# Patient Record
Sex: Female | Born: 1955 | Race: White | Hispanic: No | Smoking: Never smoker
Health system: Southern US, Community
[De-identification: ages and names within clinical notes are randomized; demographics above are authoritative.]

## PROBLEM LIST (undated history)

## (undated) DIAGNOSIS — U071 COVID-19: Secondary | ICD-10-CM

## (undated) DIAGNOSIS — N200 Calculus of kidney: Secondary | ICD-10-CM

## (undated) DIAGNOSIS — C801 Malignant (primary) neoplasm, unspecified: Secondary | ICD-10-CM

## (undated) DIAGNOSIS — G43909 Migraine, unspecified, not intractable, without status migrainosus: Secondary | ICD-10-CM

## (undated) DIAGNOSIS — R011 Cardiac murmur, unspecified: Secondary | ICD-10-CM

## (undated) DIAGNOSIS — E039 Hypothyroidism, unspecified: Secondary | ICD-10-CM

## (undated) DIAGNOSIS — T7840XA Allergy, unspecified, initial encounter: Secondary | ICD-10-CM

## (undated) DIAGNOSIS — B019 Varicella without complication: Secondary | ICD-10-CM

## (undated) HISTORY — DX: Cardiac murmur, unspecified: R01.1

## (undated) HISTORY — DX: Varicella without complication: B01.9

## (undated) HISTORY — DX: Migraine, unspecified, not intractable, without status migrainosus: G43.909

## (undated) HISTORY — DX: COVID-19: U07.1

## (undated) HISTORY — DX: Hypothyroidism, unspecified: E03.9

## (undated) HISTORY — DX: Allergy, unspecified, initial encounter: T78.40XA

## (undated) HISTORY — DX: Calculus of kidney: N20.0

## (undated) HISTORY — DX: Malignant (primary) neoplasm, unspecified: C80.1

---
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
## 1981-02-22 DIAGNOSIS — C801 Malignant (primary) neoplasm, unspecified: Secondary | ICD-10-CM

## 1981-02-22 HISTORY — DX: Malignant (primary) neoplasm, unspecified: C80.1

## 2002-02-22 HISTORY — PX: AUGMENTATION MAMMAPLASTY: SUR837

## 2004-02-23 HISTORY — PX: BREAST ENHANCEMENT SURGERY: SHX7

## 2013-11-25 LAB — HEMOGLOBIN A1C: HEMOGLOBIN A1C: 5.3

## 2014-04-12 LAB — CBC AND DIFFERENTIAL
HEMATOCRIT: 42 % (ref 36–46)
HEMOGLOBIN: 13.4 g/dL (ref 12.0–16.0)
Neutrophils Absolute: 4 /uL
PLATELETS: 253 10*3/uL (ref 150–399)
WBC: 6.2 10*3/mL

## 2014-04-12 LAB — LIPID PANEL
CHOLESTEROL: 221 mg/dL — AB (ref 0–200)
HDL: 64 mg/dL (ref 35–70)
LDL Cholesterol: 139 mg/dL
Triglycerides: 90 mg/dL (ref 40–160)

## 2014-04-12 LAB — BASIC METABOLIC PANEL
BUN: 14 mg/dL (ref 4–21)
Creatinine: 0.9 mg/dL (ref 0.5–1.1)
Glucose: 87 mg/dL
POTASSIUM: 4.1 mmol/L (ref 3.4–5.3)
Sodium: 144 mmol/L (ref 137–147)

## 2014-04-12 LAB — HEPATIC FUNCTION PANEL: ALT: 20 U/L (ref 7–35)

## 2014-04-12 LAB — TSH: TSH: 4.1 u[IU]/mL (ref 0.41–5.90)

## 2015-07-16 ENCOUNTER — Encounter (INDEPENDENT_AMBULATORY_CARE_PROVIDER_SITE_OTHER): Payer: Self-pay

## 2015-07-31 ENCOUNTER — Encounter: Payer: Self-pay | Admitting: Family Medicine

## 2015-07-31 ENCOUNTER — Ambulatory Visit (INDEPENDENT_AMBULATORY_CARE_PROVIDER_SITE_OTHER): Payer: BLUE CROSS/BLUE SHIELD | Admitting: Family Medicine

## 2015-07-31 VITALS — BP 124/70 | HR 75 | Temp 98.0°F | Ht 67.0 in | Wt 143.5 lb

## 2015-07-31 DIAGNOSIS — R6889 Other general symptoms and signs: Secondary | ICD-10-CM

## 2015-07-31 DIAGNOSIS — M79641 Pain in right hand: Secondary | ICD-10-CM | POA: Diagnosis not present

## 2015-07-31 DIAGNOSIS — M79642 Pain in left hand: Secondary | ICD-10-CM

## 2015-07-31 DIAGNOSIS — E890 Postprocedural hypothyroidism: Secondary | ICD-10-CM | POA: Diagnosis not present

## 2015-07-31 DIAGNOSIS — E785 Hyperlipidemia, unspecified: Secondary | ICD-10-CM | POA: Insufficient documentation

## 2015-07-31 DIAGNOSIS — Z130 Encounter for screening for diseases of the blood and blood-forming organs and certain disorders involving the immune mechanism: Secondary | ICD-10-CM | POA: Diagnosis not present

## 2015-07-31 DIAGNOSIS — Z0001 Encounter for general adult medical examination with abnormal findings: Secondary | ICD-10-CM | POA: Insufficient documentation

## 2015-07-31 DIAGNOSIS — Z1239 Encounter for other screening for malignant neoplasm of breast: Secondary | ICD-10-CM

## 2015-07-31 DIAGNOSIS — F419 Anxiety disorder, unspecified: Secondary | ICD-10-CM | POA: Insufficient documentation

## 2015-07-31 LAB — COMPREHENSIVE METABOLIC PANEL
ALBUMIN: 4.4 g/dL (ref 3.5–5.2)
ALK PHOS: 56 U/L (ref 39–117)
ALT: 14 U/L (ref 0–35)
AST: 18 U/L (ref 0–37)
BILIRUBIN TOTAL: 0.5 mg/dL (ref 0.2–1.2)
BUN: 18 mg/dL (ref 6–23)
CALCIUM: 9.9 mg/dL (ref 8.4–10.5)
CO2: 30 mEq/L (ref 19–32)
Chloride: 104 mEq/L (ref 96–112)
Creatinine, Ser: 0.71 mg/dL (ref 0.40–1.20)
GFR: 89.86 mL/min (ref >60.00)
Glucose, Bld: 93 mg/dL (ref 70–99)
Potassium: 4.3 mEq/L (ref 3.5–5.1)
Sodium: 140 mEq/L (ref 135–145)
TOTAL PROTEIN: 7.1 g/dL (ref 6.0–8.3)

## 2015-07-31 LAB — CBC
HCT: 39.3 % (ref 36.0–46.0)
HEMOGLOBIN: 12.9 g/dL (ref 12.0–15.0)
MCHC: 32.9 g/dL (ref 30.0–36.0)
MCV: 91.2 fl (ref 78.0–100.0)
PLATELETS: 253 10*3/uL (ref 150.0–400.0)
RBC: 4.31 Mil/uL (ref 3.87–5.11)
RDW: 13.4 % (ref 11.5–15.5)
WBC: 4.3 10*3/uL (ref 4.0–10.5)

## 2015-07-31 LAB — LIPID PANEL
Cholesterol: 227 mg/dL — ABNORMAL HIGH (ref 0–200)
HDL: 57.8 mg/dL (ref >39.00)
LDL Cholesterol: 154 mg/dL — ABNORMAL HIGH (ref 0–99)
NonHDL: 169.23
TRIGLYCERIDES: 77 mg/dL (ref 0.0–149.0)
Total CHOL/HDL Ratio: 4
VLDL: 15.4 mg/dL (ref 0.0–40.0)

## 2015-07-31 LAB — TSH: TSH: 1.69 u[IU]/mL (ref 0.35–4.50)

## 2015-07-31 MED ORDER — CYCLOBENZAPRINE HCL 5 MG PO TABS: 5.0000 mg | ORAL_TABLET | Freq: Three times a day (TID) | ORAL | Status: DC | PRN

## 2015-07-31 MED ORDER — LORAZEPAM 0.5 MG PO TABS: 0.5000 mg | ORAL_TABLET | ORAL | Status: DC | PRN

## 2015-07-31 NOTE — Patient Instructions (Signed)
Continue your current medications.  Follow up annually.   Take care  Dr. Klayton Monie   Health Maintenance, Female Adopting a healthy lifestyle and getting preventive care can go a long way to promote health and wellness. Talk with your health care provider about what schedule of regular examinations is right for you. This is a good chance for you to check in with your provider about disease prevention and staying healthy. In between checkups, there are plenty of things you can do on your own. Experts have done a lot of research about which lifestyle changes and preventive measures are most likely to keep you healthy. Ask your health care provider for more information. WEIGHT AND DIET  Eat a healthy diet  Be sure to include plenty of vegetables, fruits, low-fat dairy products, and lean protein.  Do not eat a lot of foods high in solid fats, added sugars, or salt.  Get regular exercise. This is one of the most important things you can do for your health.  Most adults should exercise for at least 150 minutes each week. The exercise should increase your heart rate and make you sweat (moderate-intensity exercise).  Most adults should also do strengthening exercises at least twice a week. This is in addition to the moderate-intensity exercise.  Maintain a healthy weight  Body mass index (BMI) is a measurement that can be used to identify possible weight problems. It estimates body fat based on height and weight. Your health care provider can help determine your BMI and help you achieve or maintain a healthy weight.  For females 20 years of age and older:   A BMI below 18.5 is considered underweight.  A BMI of 18.5 to 24.9 is normal.  A BMI of 25 to 29.9 is considered overweight.  A BMI of 30 and above is considered obese.  Watch levels of cholesterol and blood lipids  You should start having your blood tested for lipids and cholesterol at 58 years of age, then have this test every 5  years.  You may need to have your cholesterol levels checked more often if:  Your lipid or cholesterol levels are high.  You are older than 58 years of age.  You are at high risk for heart disease.  CANCER SCREENING   Lung Cancer  Lung cancer screening is recommended for adults 55-80 years old who are at high risk for lung cancer because of a history of smoking.  A yearly low-dose CT scan of the lungs is recommended for people who:  Currently smoke.  Have quit within the past 15 years.  Have at least a 30-pack-year history of smoking. A pack year is smoking an average of one pack of cigarettes a day for 1 year.  Yearly screening should continue until it has been 15 years since you quit.  Yearly screening should stop if you develop a health problem that would prevent you from having lung cancer treatment.  Breast Cancer  Practice breast self-awareness. This means understanding how your breasts normally appear and feel.  It also means doing regular breast self-exams. Let your health care provider know about any changes, no matter how small.  If you are in your 20s or 30s, you should have a clinical breast exam (CBE) by a health care provider every 1-3 years as part of a regular health exam.  If you are 40 or older, have a CBE every year. Also consider having a breast X-ray (mammogram) every year.  If you have a   history of breast cancer, talk to your health care provider about genetic screening.  If you are at high risk for breast cancer, talk to your health care provider about having an MRI and a mammogram every year.  Breast cancer gene (BRCA) assessment is recommended for women who have family members with BRCA-related cancers. BRCA-related cancers include:  Breast.  Ovarian.  Tubal.  Peritoneal cancers.  Results of the assessment will determine the need for genetic counseling and BRCA1 and BRCA2 testing. Cervical Cancer Your health care provider may  recommend that you be screened regularly for cancer of the pelvic organs (ovaries, uterus, and vagina). This screening involves a pelvic examination, including checking for microscopic changes to the surface of your cervix (Pap test). You may be encouraged to have this screening done every 3 years, beginning at age 77.  For women ages 5-65, health care providers may recommend pelvic exams and Pap testing every 3 years, or they may recommend the Pap and pelvic exam, combined with testing for human papilloma virus (HPV), every 5 years. Some types of HPV increase your risk of cervical cancer. Testing for HPV may also be done on women of any age with unclear Pap test results.  Other health care providers may not recommend any screening for nonpregnant women who are considered low risk for pelvic cancer and who do not have symptoms. Ask your health care provider if a screening pelvic exam is right for you.  If you have had past treatment for cervical cancer or a condition that could lead to cancer, you need Pap tests and screening for cancer for at least 20 years after your treatment. If Pap tests have been discontinued, your risk factors (such as having a new sexual partner) need to be reassessed to determine if screening should resume. Some women have medical problems that increase the chance of getting cervical cancer. In these cases, your health care provider may recommend more frequent screening and Pap tests. Colorectal Cancer  This type of cancer can be detected and often prevented.  Routine colorectal cancer screening usually begins at 58 years of age and continues through 58 years of age.  Your health care provider may recommend screening at an earlier age if you have risk factors for colon cancer.  Your health care provider may also recommend using home test kits to check for hidden blood in the stool.  A small camera at the end of a tube can be used to examine your colon directly  (sigmoidoscopy or colonoscopy). This is done to check for the earliest forms of colorectal cancer.  Routine screening usually begins at age 34.  Direct examination of the colon should be repeated every 5-10 years through 58 years of age. However, you may need to be screened more often if early forms of precancerous polyps or small growths are found. Skin Cancer  Check your skin from head to toe regularly.  Tell your health care provider about any new moles or changes in moles, especially if there is a change in a mole's shape or color.  Also tell your health care provider if you have a mole that is larger than the size of a pencil eraser.  Always use sunscreen. Apply sunscreen liberally and repeatedly throughout the day.  Protect yourself by wearing long sleeves, pants, a wide-brimmed hat, and sunglasses whenever you are outside. HEART DISEASE, DIABETES, AND HIGH BLOOD PRESSURE   High blood pressure causes heart disease and increases the risk of stroke. High blood pressure  is more likely to develop in:  People who have blood pressure in the high end of the normal range (130-139/85-89 mm Hg).  People who are overweight or obese.  People who are African American.  If you are 20-25 years of age, have your blood pressure checked every 3-5 years. If you are 71 years of age or older, have your blood pressure checked every year. You should have your blood pressure measured twice--once when you are at a hospital or clinic, and once when you are not at a hospital or clinic. Record the average of the two measurements. To check your blood pressure when you are not at a hospital or clinic, you can use:  An automated blood pressure machine at a pharmacy.  A home blood pressure monitor.  If you are between 41 years and 9 years old, ask your health care provider if you should take aspirin to prevent strokes.  Have regular diabetes screenings. This involves taking a blood sample to check your  fasting blood sugar level.  If you are at a normal weight and have a low risk for diabetes, have this test once every three years after 58 years of age.  If you are overweight and have a high risk for diabetes, consider being tested at a younger age or more often. PREVENTING INFECTION  Hepatitis B  If you have a higher risk for hepatitis B, you should be screened for this virus. You are considered at high risk for hepatitis B if:  You were born in a country where hepatitis B is common. Ask your health care provider which countries are considered high risk.  Your parents were born in a high-risk country, and you have not been immunized against hepatitis B (hepatitis B vaccine).  You have HIV or AIDS.  You use needles to inject street drugs.  You live with someone who has hepatitis B.  You have had sex with someone who has hepatitis B.  You get hemodialysis treatment.  You take certain medicines for conditions, including cancer, organ transplantation, and autoimmune conditions. Hepatitis C  Blood testing is recommended for:  Everyone born from [birth year] through 1963.  Anyone with known risk factors for hepatitis C. Sexually transmitted infections (STIs)  You should be screened for sexually transmitted infections (STIs) including gonorrhea and chlamydia if:  You are sexually active and are younger than 58 years of age.  You are older than 58 years of age and your health care provider tells you that you are at risk for this type of infection.  Your sexual activity has changed since you were last screened and you are at an increased risk for chlamydia or gonorrhea. Ask your health care provider if you are at risk.  If you do not have HIV, but are at risk, it may be recommended that you take a prescription medicine daily to prevent HIV infection. This is called pre-exposure prophylaxis (PrEP). You are considered at risk if:  You are sexually active and do not regularly use condoms or  know the HIV status of your partner(s).  You take drugs by injection.  You are sexually active with a partner who has HIV. Talk with your health care provider about whether you are at high risk of being infected with HIV. If you choose to begin PrEP, you should first be tested for HIV. You should then be tested every 3 months for as long as you are taking PrEP.  PREGNANCY   If you are premenopausal and  you may become pregnant, ask your health care provider about preconception counseling.  If you may become pregnant, take 400 to 800 micrograms (mcg) of folic acid every day.  If you want to prevent pregnancy, talk to your health care provider about birth control (contraception). OSTEOPOROSIS AND MENOPAUSE   Osteoporosis is a disease in which the bones lose minerals and strength with aging. This can result in serious bone fractures. Your risk for osteoporosis can be identified using a bone density scan.  If you are 71 years of age or older, or if you are at risk for osteoporosis and fractures, ask your health care provider if you should be screened.  Ask your health care provider whether you should take a calcium or vitamin D supplement to lower your risk for osteoporosis.  Menopause may have certain physical symptoms and risks.  Hormone replacement therapy may reduce some of these symptoms and risks. Talk to your health care provider about whether hormone replacement therapy is right for you.  HOME CARE INSTRUCTIONS   Schedule regular health, dental, and eye exams.  Stay current with your immunizations.   Do not use any tobacco products including cigarettes, chewing tobacco, or electronic cigarettes.  If you are pregnant, do not drink alcohol.  If you are breastfeeding, limit how much and how often you drink alcohol.  Limit alcohol intake to no more than 1 drink per day for nonpregnant women. One drink equals 12 ounces of beer, 5 ounces of wine, or 1 ounces of hard liquor.  Do  not use street drugs.  Do not share needles.  Ask your health care provider for help if you need support or information about quitting drugs.  Tell your health care provider if you often feel depressed.  Tell your health care provider if you have ever been abused or do not feel safe at home.   This information is not intended to replace advice given to you by your health care provider. Make sure you discuss any questions you have with your health care provider.   Document Released: 08/24/2010 Document Revised: 03/01/2014 Document Reviewed: 01/10/2013 Elsevier Interactive Patient Education [employer].

## 2015-07-31 NOTE — Progress Notes (Signed)
Pre visit review using our clinic review tool, if applicable. No additional management support is needed unless otherwise documented below in the visit note. 

## 2015-07-31 NOTE — Assessment & Plan Note (Signed)
In need of mammogram. Will order. Pap smear up-to-date. Colonoscopy up-to-date. Immunizations up-to-date. Screening labs today.

## 2015-07-31 NOTE — Assessment & Plan Note (Signed)
New problem. Suspect underlying OA. Discuss work up and patient declined at this time. Advised PRN Tylenol/motrin.

## 2015-07-31 NOTE — Progress Notes (Signed)
Subjective:  Patient ID: Jenna Davidson, female    DOB: [birth year]  Age: 61 y.o. MRN: [id number]  CC: Establish care  HPI Jenna Davidson is a 60 y.o. female presents to the clinic today to establish care. Additional concern as below.  Preventative Healthcare  Pap smear: 2015. Up to date.   Mammogram: In Need of mammogram. Was last done in 2015.  Colonoscopy: Up-to-date. Was done in 2011.  Immunizations  Tetanus - up-to-date.  Pneumococcal - Not indicated at this time.  Hepatitis C screening - Candidate for.  Labs: Screening labs today.  Exercise: Exercises regularly.  Alcohol use: See below.  Smoking/tobacco use: Nonsmoker.  STD/HIV testing: Low risk.  Regular dental exams: Yes.   Wears seat belt: Yes.   Hand pain  Patient reports that she has been experiencing bilateral hand pain particularly in the morning.  Pain is diffuse and nonfocal.  She states that this has been present for the past several months.  She states that it improves later in the morning after movement/activity.  She reports that she often has swelling in the morning as well.  No known exacerbating factors.  No reported history of RA.  PMH, Surgical Hx, Family Hx, Social History reviewed and updated as below.  Past Medical History  Diagnosis Date  . Chicken pox   . Migraines   . Kidney stones   . Allergy   . Heart murmur   . Hypothyroidism   . Cancer ([place]) 6606    follicular papillary carcinoma; s/p thyroidectomy   Past Surgical History  Procedure Laterality Date  . Breast enhancement surgery  2006    breast augmentation   . Tonsillectomy and adenoidectomy  1967   Family History  Problem Relation Age of Onset  . Alcohol abuse Father   . Lung cancer Father   . Heart disease Father   . Diabetes Maternal Grandmother   . Diabetes Maternal Grandfather   . Uterine cancer Paternal Grandmother   . Heart disease Paternal Grandmother   . Heart disease Paternal Grandfather   .  Stroke Paternal Grandfather    Social History  Substance Use Topics  . Smoking status: Never Smoker   . Smokeless tobacco: Never Used  . Alcohol Use: 0.0 - 0.6 oz/week    0-1 Standard drinks or equivalent per week   Review of Systems  Musculoskeletal:       Hand pain, knee pain, foot pain.   Psychiatric/Behavioral: Positive for sleep disturbance. The patient is nervous/anxious.   All other systems reviewed and are negative.  Objective:   Today's Vitals: BP 124/70 mmHg  Pulse 75  Temp(Src) 98 F (36.7 C) (Oral)  Ht _0  (1.702 m)  Wt 143 lb 8 oz (65.091 kg)  BMI 22.47 kg/m2  SpO2 98%  Physical Exam  Constitutional: She is oriented to person, place, and time. She appears well-developed. No distress.  HENT:  Head: Normocephalic and atraumatic.  Normal TMs bilaterally.  Eyes: Conjunctivae are normal. No scleral icterus.  Neck: Neck supple.  Midline scar noted from prior thyroidectomy.  Cardiovascular: Normal rate and regular rhythm.   2/6 systolic murmur, Right second intercostal space.   Pulmonary/Chest: Effort normal and breath sounds normal. She has no wheezes. She has no rales.  Abdominal: Soft. She exhibits no distension. There is no tenderness.  Musculoskeletal:  Hands - no swelling, warmth, erythema noted. Good range of motion of digits  Lymphadenopathy:    She has no cervical adenopathy.  Neurological: She is alert  and oriented to person, place, and time.  Skin: Skin is warm and dry. No rash noted.  Psychiatric: She has a normal mood and affect.  Vitals reviewed.  Assessment & Plan:   Problem List Items Addressed This Visit    Bilateral hand pain    New problem. Suspect underlying OA. Discuss work up and patient declined at this time. Advised PRN Tylenol/motrin.      Encounter for preventative adult health care exam with abnormal findings - Primary    In need of mammogram. Will order. Pap smear up-to-date. Colonoscopy up-to-date. Immunizations  up-to-date. Screening labs today.      Hyperlipidemia   Relevant Orders   Lipid Profile   Postoperative hypothyroidism   Relevant Medications   SYNTHROID 112 MCG tablet   Other Relevant Orders   Comp Met (CMET)   TSH    Other Visit Diagnoses    Screening for deficiency anemia        Relevant Orders    CBC    Screening for breast cancer        Relevant Orders    MM DIGITAL SCREENING BILATERAL       Outpatient Encounter Prescriptions as of 07/31/2015  Medication Sig  . Acetaminophen (TYLENOL PO) Take by mouth as needed.  . chlorpheniramine (CHLOR-TRIMETON) 4 MG tablet Take 4 mg by mouth as needed for allergies.  . Cholecalciferol (D3 ADULT PO) Take 5,000 Units by mouth.  . Cyanocobalamin 5000 MCG CAPS Take by mouth.  . cyclobenzaprine (FLEXERIL) 5 MG tablet Take 1 tablet (5 mg total) by mouth 3 (three) times daily as needed for muscle spasms.  . Glucosamine 500 MG CAPS Take by mouth.  . Ibuprofen-Diphenhydramine Cit (IBUPROFEN PM PO) Take by mouth as needed.  Marland Kitchen LORazepam (ATIVAN) 0.5 MG tablet Take 1 tablet (0.5 mg total) by mouth as needed for anxiety.  . Magnesium 500 MG CAPS Take by mouth.  . Naproxen Sodium (ALEVE PO) Take by mouth.  . Omega-3 Fatty Acids (OMEGA 3 PO) Take 1,000 mg by mouth.  . promethazine (PHENERGAN) 25 MG tablet as needed.  . rizatriptan (MAXALT) 5 MG tablet as needed.  Marland Kitchen SYNTHROID 112 MCG tablet daily.  . Zinc 50 MG TABS Take by mouth.  . [DISCONTINUED] cyclobenzaprine (FLEXERIL) 5 MG tablet Take 5 mg by mouth as needed for muscle spasms.  . [DISCONTINUED] LORazepam (ATIVAN) 0.5 MG tablet Take 0.5 mg by mouth as needed for anxiety.   No facility-administered encounter medications on file as of 07/31/2015.   Follow-up: Annual  Thersa Salt DO [facility]

## 2015-09-08 ENCOUNTER — Ambulatory Visit: Payer: BLUE CROSS/BLUE SHIELD | Attending: Family Medicine

## 2015-10-12 DIAGNOSIS — R51 Headache: Secondary | ICD-10-CM | POA: Diagnosis not present

## 2015-10-12 DIAGNOSIS — R0981 Nasal congestion: Secondary | ICD-10-CM | POA: Diagnosis not present

## 2015-11-05 ENCOUNTER — Encounter: Payer: Self-pay | Admitting: Family Medicine

## 2015-11-05 ENCOUNTER — Ambulatory Visit (INDEPENDENT_AMBULATORY_CARE_PROVIDER_SITE_OTHER): Payer: BLUE CROSS/BLUE SHIELD | Admitting: Family Medicine

## 2015-11-05 DIAGNOSIS — R0982 Postnasal drip: Secondary | ICD-10-CM | POA: Insufficient documentation

## 2015-11-05 DIAGNOSIS — E890 Postprocedural hypothyroidism: Secondary | ICD-10-CM | POA: Diagnosis not present

## 2015-11-05 DIAGNOSIS — Z23 Encounter for immunization: Secondary | ICD-10-CM

## 2015-11-05 DIAGNOSIS — E785 Hyperlipidemia, unspecified: Secondary | ICD-10-CM | POA: Diagnosis not present

## 2015-11-05 DIAGNOSIS — F419 Anxiety disorder, unspecified: Secondary | ICD-10-CM | POA: Diagnosis not present

## 2015-11-05 NOTE — Progress Notes (Signed)
Subjective:  Patient ID: Jenna Davidson, female    DOB: 19-Dec-1955  Age: 60 y.o. MRN: [id number]  CC: Follow up  HPI:  60 year old female with hypothyroidism, hyperlipidemia, anxiety presents for follow-up. Patient has a complaint of drainage/post nasal drip.  Anxiety  Stable on PRN Ativan.  Doing well.  HLD  Stable.  Not currently being treated. Will discuss today.  Hypothyroidism  Stable on Synthroid 112.  No weight gain, increasing fatigue, constipation or other symptoms.  Drainage/post nasal drip  Begin initially with a likely respiratory infection.  Patient has been experiencing postnasal drip/drainage for the past month.  Patient initially took some Sudafed without improvement.  She reports associated intermittent sore throat.  Exacerbated by allergies and potentially by "scents".  No recent fever, chills. No other symptoms.  Social Hx   Social History   Social History  . Marital status: Widowed    Spouse name: N/A  . Number of children: N/A  . Years of education: N/A   Social History Main Topics  . Smoking status: Never Smoker  . Smokeless tobacco: Never Used  . Alcohol use 0.0 - 0.6 oz/week  . Drug use: No  . Sexual activity: Not Asked   Other Topics Concern  . None   Social History Narrative  . None    Review of Systems  Constitutional: Negative.   HENT: Positive for postnasal drip and sore throat.    Objective:  BP 123/85 (BP Location: Right Arm, Patient Position: Sitting, Cuff Size: Normal)   Pulse 77   Temp 98.2 F (36.8 C) (Oral)   Wt 142 lb 6 oz (64.6 kg)   SpO2 100%   BMI 22.30 kg/m   BP/Weight [id number]  Systolic BP [id number]  Diastolic BP 85 70  Wt. (Lbs) 142.38 143.5  BMI 22.3 22.47   Physical Exam  Constitutional: She is oriented to person, place, and time. She appears well-developed. No distress.  HENT:  Head: Normocephalic and atraumatic.  Mouth/Throat: Oropharynx is clear and moist.  Normal TM's  bilaterally.  Cardiovascular: Normal rate and regular rhythm.   Pulmonary/Chest: Effort normal. She has no wheezes. She has no rales.  Neurological: She is alert and oriented to person, place, and time.  Psychiatric: She has a normal mood and affect.  Vitals reviewed.   Lab Results  Component Value Date   WBC 4.3 07/31/2015   HGB 12.9 07/31/2015   HCT 39.3 07/31/2015   PLT 253.0 07/31/2015   GLUCOSE 93 07/31/2015   CHOL 227 (H) 07/31/2015   TRIG 77.0 07/31/2015   HDL 57.80 07/31/2015   LDLCALC 154 (H) 07/31/2015   ALT 14 07/31/2015   AST 18 07/31/2015   NA 140 07/31/2015   K 4.3 07/31/2015   CL 104 07/31/2015   CREATININE 0.71 07/31/2015   BUN 18 07/31/2015   CO2 30 07/31/2015   TSH 1.69 07/31/2015   HGBA1C 5.3 11/25/2013    Assessment & Plan:   Problem List Items Addressed This Visit    Postoperative hypothyroidism    Stable. Continue current dose of Synthroid.      Hyperlipidemia    Stable. ASCVD risk score 2.7%. Will continue to monitor closely. No statin at this time.      Anxiety    Stable. Continue Ativan.      Post-nasal drip    New problem. No evidence of bacterial infection. Advised use of Flonase and over-the-counter antihistamine.       Other Visit Diagnoses   None.  Meds ordered this encounter  Medications  . TURMERIC PO    Sig: Take by mouth.    Follow-up: Annually  [facility]

## 2015-11-05 NOTE — Assessment & Plan Note (Deleted)
Stable. Continue current dose of Synthroid. 

## 2015-11-05 NOTE — Assessment & Plan Note (Signed)
Stable. Continue Ativan. 

## 2015-11-05 NOTE — Patient Instructions (Signed)
Flonase and the antihistamine for your post nasal drip.  Continue your medications.  Follow up annually  Take care  Dr. Lacinda Axon

## 2015-11-05 NOTE — Progress Notes (Signed)
Pre visit review using our clinic review tool, if applicable. No additional management support is needed unless otherwise documented below in the visit note. 

## 2015-11-05 NOTE — Assessment & Plan Note (Signed)
Stable. Continue current dose of Synthroid. 

## 2015-11-05 NOTE — Assessment & Plan Note (Signed)
Stable. ASCVD risk score 2.7%. Will continue to monitor closely. No statin at this time.

## 2015-11-05 NOTE — Assessment & Plan Note (Signed)
New problem. No evidence of bacterial infection. Advised use of Flonase and over-the-counter antihistamine.

## 2015-11-16 ENCOUNTER — Encounter: Payer: Self-pay | Admitting: Family Medicine

## 2015-11-17 ENCOUNTER — Encounter: Payer: Self-pay | Admitting: Family

## 2015-11-17 ENCOUNTER — Ambulatory Visit (INDEPENDENT_AMBULATORY_CARE_PROVIDER_SITE_OTHER): Payer: BLUE CROSS/BLUE SHIELD | Admitting: Family

## 2015-11-17 ENCOUNTER — Telehealth: Payer: Self-pay | Admitting: *Deleted

## 2015-11-17 VITALS — BP 128/78 | HR 67 | Temp 98.7°F | Ht 67.0 in | Wt 145.5 lb

## 2015-11-17 DIAGNOSIS — H1089 Other conjunctivitis: Secondary | ICD-10-CM | POA: Diagnosis not present

## 2015-11-17 DIAGNOSIS — A499 Bacterial infection, unspecified: Secondary | ICD-10-CM | POA: Diagnosis not present

## 2015-11-17 DIAGNOSIS — H109 Unspecified conjunctivitis: Secondary | ICD-10-CM

## 2015-11-17 MED ORDER — CIPROFLOXACIN HCL 0.3 % OP SOLN: OPHTHALMIC | 0 refills | Status: DC

## 2015-11-17 NOTE — Progress Notes (Signed)
Pre visit review using our clinic review tool, if applicable. No additional management support is needed unless otherwise documented below in the visit note. 

## 2015-11-17 NOTE — Progress Notes (Addendum)
Subjective:    Patient ID: Jenna Davidson, female    DOB: 04/19/1955, 60 y.o.   MRN: [id number]  CC: Jenna Davidson is a 60 y.o. female who presents today for an acute visit.    HPI: Patient here for evaluation of bilateral pinkeye which started over the weekend 2 days ago. Started in right eye and started in left eye. Right eyelid has been swollen, resolved. Had been with grandson that day. Stuck shut. No fever, vision changes, eye pain, photophobia, foreign body feeling in eye, gritty sensation. Endorses sinus congestion which started yesteday. Has had post nasal drip and started on Flonase. Has been using OTC eye drops and warm compresses with some improvement. Personally reviewed images from the patient sent via Mychart yesterday morning.  wear contact lenses with good hygiene.      HISTORY:  Past Medical History:  Diagnosis Date  . Allergy   . Cancer ([place]) [id number]   follicular papillary carcinoma; s/p thyroidectomy  . Chicken pox   . Heart murmur   . Hypothyroidism   . Kidney stones   . Migraines    Past Surgical History:  Procedure Laterality Date  . BREAST ENHANCEMENT SURGERY  2006   breast augmentation   . TONSILLECTOMY AND ADENOIDECTOMY  1967   Family History  Problem Relation Age of Onset  . Alcohol abuse Father   . Lung cancer Father   . Heart disease Father   . Diabetes Maternal Grandmother   . Diabetes Maternal Grandfather   . Uterine cancer Paternal Grandmother   . Heart disease Paternal Grandmother   . Heart disease Paternal Grandfather   . Stroke Paternal Grandfather     Allergies: Review of patient's allergies indicates no known allergies. Current Outpatient Prescriptions on File Prior to Visit  Medication Sig Dispense Refill  . Acetaminophen (TYLENOL PO) Take by mouth as needed.    . chlorpheniramine (CHLOR-TRIMETON) 4 MG tablet Take 4 mg by mouth as needed for allergies.    . Cholecalciferol (D3 ADULT PO) Take 5,000 Units by mouth.    . Cyanocobalamin  5000 MCG CAPS Take by mouth.    . cyclobenzaprine (FLEXERIL) 5 MG tablet Take 1 tablet (5 mg total) by mouth 3 (three) times daily as needed for muscle spasms. 30 tablet 3  . Glucosamine 500 MG CAPS Take by mouth.    . Ibuprofen-Diphenhydramine Cit (IBUPROFEN PM PO) Take by mouth as needed.    Marland Kitchen LORazepam (ATIVAN) 0.5 MG tablet Take 1 tablet (0.5 mg total) by mouth as needed for anxiety. 30 tablet 0  . Magnesium 500 MG CAPS Take by mouth.    . Naproxen Sodium (ALEVE PO) Take by mouth.    . Omega-3 Fatty Acids (OMEGA 3 PO) Take 1,000 mg by mouth.    . promethazine (PHENERGAN) 25 MG tablet as needed.    . rizatriptan (MAXALT) 5 MG tablet as needed.    Marland Kitchen SYNTHROID 112 MCG tablet daily.    . TURMERIC PO Take by mouth.    . Zinc 50 MG TABS Take by mouth.     No current facility-administered medications on file prior to visit.     Social History  Substance Use Topics  . Smoking status: Never Smoker  . Smokeless tobacco: Never Used  . Alcohol use 0.0 - 0.6 oz/week    Review of Systems  Constitutional: Negative for chills and fever.  HENT: Negative for congestion, sinus pressure and sore throat.   Eyes: Positive for discharge (clear), redness and  itching. Negative for photophobia, pain and visual disturbance.  Respiratory: Negative for cough.   Cardiovascular: Negative for chest pain and palpitations.  Gastrointestinal: Negative for nausea and vomiting.      Objective:    BP 128/78   Pulse 67   Temp 98.7 F (37.1 C) (Oral)   Ht 5\' 7"  (1.702 m)   Wt 145 lb 8 oz (66 kg)   BMI 22.79 kg/m    Physical Exam  Constitutional: She appears well-developed and well-nourished.  HENT:  Head: Normocephalic and atraumatic.  Right Ear: Hearing, tympanic membrane, external ear and ear canal normal. No drainage, swelling or tenderness. No foreign bodies. Tympanic membrane is not erythematous and not bulging. No middle ear effusion. No decreased hearing is noted.  Left Ear: Hearing, tympanic  membrane, external ear and ear canal normal. No drainage, swelling or tenderness. No foreign bodies. Tympanic membrane is not erythematous and not bulging.  No middle ear effusion. No decreased hearing is noted.  Nose: No rhinorrhea. Right sinus exhibits no maxillary sinus tenderness and no frontal sinus tenderness. Left sinus exhibits no maxillary sinus tenderness and no frontal sinus tenderness.  Mouth/Throat: Uvula is midline and mucous membranes are normal. Posterior oropharyngeal edema present. No oropharyngeal exudate, posterior oropharyngeal erythema or tonsillar abscesses.  Eyes: EOM are normal. Pupils are equal, round, and reactive to light. Lids are everted and swept, no foreign bodies found. Right eye exhibits no discharge and no hordeolum. No foreign body present in the right eye. Left eye exhibits no discharge and no hordeolum. No foreign body present in the left eye. Right conjunctiva is injected. Right conjunctiva has no hemorrhage. Left conjunctiva is injected. Left conjunctiva has no hemorrhage. No scleral icterus.  No external eye lesions. No eyelid swelling. Surrounding skin intact.   Right eye:   Diffuse injection of the conjunctiva. No white spots, opacity, or foreign body appreciated. No collection of blood or pus in the anterior chamber. No ciliary flush surrounding iris.  No photophobia or eye pain appreciated during exam.    Left eye:   Minimal injection of the conjunctiva. No white spots, opacity, or foreign body appreciated. No collection of blood or pus in the anterior chamber. No ciliary flush surrounding iris.  No photophobia or eye pain appreciated during exam.   Cardiovascular: Regular rhythm, normal heart sounds and normal pulses.   Pulmonary/Chest: Effort normal and breath sounds normal. She has no wheezes. She has no rhonchi. She has no rales.  Lymphadenopathy:       Head (right side): No submental, no submandibular, no tonsillar, no preauricular, no posterior  auricular and no occipital adenopathy present.       Head (left side): No submental, no submandibular, no tonsillar, no preauricular, no posterior auricular and no occipital adenopathy present.    She has no cervical adenopathy.  Neurological: She is alert.  Skin: Skin is warm and dry.  Psychiatric: She has a normal mood and affect. Her speech is normal and behavior is normal. Thought content normal.  Vitals reviewed.      Assessment & Plan:  1. Bacterial conjunctivitis No vision changes, floaters, reduced acuity, photophobia, foreign body sensation to suggest keratitis or iritis. The onset has been longer than 24 hours, which has improved making keratitis less likely. Patient is a contact lens wearer however she reports good hygiene with contact lenses. Flouroquinolone OP appropriate to cover for pseudomonas. Return precautions given.   - ciprofloxacin (CILOXAN) 0.3 % ophthalmic solution; 1-2 gtt in eye(s) q2h  while awake x 2 days, then q4h x 5 days.  Dispense: 5 mL; Refill: 0     I am having Ms. Andre start on ciprofloxacin. I am also having her maintain her SYNTHROID, rizatriptan, promethazine, chlorpheniramine, Ibuprofen-Diphenhydramine Cit (IBUPROFEN PM PO), Acetaminophen (TYLENOL PO), Naproxen Sodium (ALEVE PO), Omega-3 Fatty Acids (OMEGA 3 PO), Cholecalciferol (D3 ADULT PO), Glucosamine, Cyanocobalamin, Zinc, Magnesium, cyclobenzaprine, LORazepam, and TURMERIC PO.   Meds ordered this encounter  Medications  . ciprofloxacin (CILOXAN) 0.3 % ophthalmic solution    Sig: 1-2 gtt in eye(s) q2h while awake x 2 days, then q4h x 5 days.    Dispense:  5 mL    Refill:  0    Order Specific Question:   Supervising Provider    Answer:   Crecencio Mc [[id number]]    Return precautions given.   Risks, benefits, and alternatives of the medications and treatment plan prescribed today were discussed, and patient expressed understanding.   Education regarding symptom management and diagnosis  given to patient on AVS.  Continue to follow with Coral Spikes, DO for routine health maintenance.   Jenna Davidson and I agreed with plan.   Mable Paris, FNP

## 2015-11-17 NOTE — Telephone Encounter (Signed)
Scheduled pt for possible pink eye

## 2015-11-17 NOTE — Patient Instructions (Signed)
Please let me know urgently if you have eye pain, vision changes, or sensitivity to light.  You should start to see improvement with 12-24 hours.   If there is no improvement in your symptoms, or if there is any worsening of symptoms, or if you have any additional concerns, please return for re-evaluation; or, if we are closed, consider going to the Emergency Room for evaluation if symptoms urgent.   Bacterial Conjunctivitis Bacterial conjunctivitis, commonly called pink eye, is an inflammation of the clear membrane that covers the white part of the eye (conjunctiva). The inflammation can also happen on the underside of the eyelids. The blood vessels in the conjunctiva become inflamed, causing the eye to become red or pink. Bacterial conjunctivitis may spread easily from one eye to another and from person to person (contagious).  CAUSES  Bacterial conjunctivitis is caused by bacteria. The bacteria may come from your own skin, your upper respiratory tract, or from someone else with bacterial conjunctivitis. SYMPTOMS  The normally white color of the eye or the underside of the eyelid is usually pink or red. The pink eye is usually associated with irritation, tearing, and some sensitivity to light. Bacterial conjunctivitis is often associated with a thick, yellowish discharge from the eye. The discharge may turn into a crust on the eyelids overnight, which causes your eyelids to stick together. If a discharge is present, there may also be some blurred vision in the affected eye. DIAGNOSIS  Bacterial conjunctivitis is diagnosed by your caregiver through an eye exam and the symptoms that you report. Your caregiver looks for changes in the surface tissues of your eyes, which may point to the specific type of conjunctivitis. A sample of any discharge may be collected on a cotton-tip swab if you have a severe case of conjunctivitis, if your cornea is affected, or if you keep getting repeat infections that do  not respond to treatment. The sample will be sent to a lab to see if the inflammation is caused by a bacterial infection and to see if the infection will respond to antibiotic medicines. TREATMENT   Bacterial conjunctivitis is treated with antibiotics. Antibiotic eyedrops are most often used. However, antibiotic ointments are also available. Antibiotics pills are sometimes used. Artificial tears or eye washes may ease discomfort. HOME CARE INSTRUCTIONS   To ease discomfort, apply a cool, clean washcloth to your eye for 10-20 minutes, 3-4 times a day.  Gently wipe away any drainage from your eye with a warm, wet washcloth or a cotton ball.  Wash your hands often with soap and water. Use paper towels to dry your hands.  Do not share towels or washcloths. This may spread the infection.  Change or wash your pillowcase every day.  You should not use eye makeup until the infection is gone.  Do not operate machinery or drive if your vision is blurred.  Stop using contact lenses. Ask your caregiver how to sterilize or replace your contacts before using them again. This depends on the type of contact lenses that you use.  When applying medicine to the infected eye, do not touch the edge of your eyelid with the eyedrop bottle or ointment tube. SEEK IMMEDIATE MEDICAL CARE IF:   Your infection has not improved within 3 days after beginning treatment.  You had yellow discharge from your eye and it returns.  You have increased eye pain.  Your eye redness is spreading.  Your vision becomes blurred.  You have a fever or persistent symptoms  for more than 2-3 days.  You have a fever and your symptoms suddenly get worse.  You have facial pain, redness, or swelling. MAKE SURE YOU:   Understand these instructions.  Will watch your condition.  Will get help right away if you are not doing well or get worse.   This information is not intended to replace advice given to you by your health  care provider. Make sure you discuss any questions you have with your health care provider.   Document Released: 02/08/2005 Document Revised: 03/01/2014 Document Reviewed: 07/12/2011 Elsevier Interactive Patient Education [employer].

## 2016-01-08 ENCOUNTER — Encounter: Payer: Self-pay | Admitting: Family Medicine

## 2016-01-09 ENCOUNTER — Other Ambulatory Visit: Payer: Self-pay | Admitting: Family Medicine

## 2016-01-09 MED ORDER — SYNTHROID 112 MCG PO TABS: 112.0000 ug | ORAL_TABLET | Freq: Every day | ORAL | 0 refills | Status: DC

## 2016-01-09 MED ORDER — SYNTHROID 112 MCG PO TABS: 112.0000 ug | ORAL_TABLET | Freq: Every day | ORAL | 2 refills | Status: DC

## 2016-01-09 MED ORDER — RIZATRIPTAN BENZOATE 5 MG PO TABS: ORAL_TABLET | ORAL | 0 refills | Status: DC

## 2016-01-09 NOTE — Telephone Encounter (Signed)
Historical medication. Pt last seen 11/17/15. Please advise?

## 2016-01-12 ENCOUNTER — Other Ambulatory Visit: Payer: Self-pay

## 2016-02-05 ENCOUNTER — Other Ambulatory Visit: Payer: Self-pay | Admitting: Family Medicine

## 2016-05-18 DIAGNOSIS — N3001 Acute cystitis with hematuria: Secondary | ICD-10-CM | POA: Diagnosis not present

## 2016-05-18 DIAGNOSIS — R300 Dysuria: Secondary | ICD-10-CM | POA: Diagnosis not present

## 2016-07-29 ENCOUNTER — Other Ambulatory Visit (HOSPITAL_COMMUNITY)
Admission: RE | Admit: 2016-07-29 | Discharge: 2016-07-29 | Disposition: A | Discharge status: Home / Self Care | Payer: BLUE CROSS/BLUE SHIELD | Source: Ambulatory Visit | Attending: Family Medicine | Admitting: Family Medicine

## 2016-07-29 ENCOUNTER — Encounter: Payer: Self-pay | Admitting: Family Medicine

## 2016-07-29 ENCOUNTER — Ambulatory Visit (INDEPENDENT_AMBULATORY_CARE_PROVIDER_SITE_OTHER): Payer: BLUE CROSS/BLUE SHIELD | Admitting: Family Medicine

## 2016-07-29 VITALS — BP 122/74 | HR 81 | Temp 98.0°F | Resp 16 | Wt 138.0 lb

## 2016-07-29 DIAGNOSIS — Z124 Encounter for screening for malignant neoplasm of cervix: Secondary | ICD-10-CM

## 2016-07-29 DIAGNOSIS — N898 Other specified noninflammatory disorders of vagina: Secondary | ICD-10-CM | POA: Diagnosis not present

## 2016-07-29 DIAGNOSIS — R87810 Cervical high risk human papillomavirus (HPV) DNA test positive: Secondary | ICD-10-CM | POA: Insufficient documentation

## 2016-07-29 DIAGNOSIS — N870 Mild cervical dysplasia: Secondary | ICD-10-CM | POA: Insufficient documentation

## 2016-07-29 MED ORDER — LORAZEPAM 0.5 MG PO TABS: 0.5000 mg | ORAL_TABLET | ORAL | 0 refills | Status: DC | PRN

## 2016-07-29 MED ORDER — RIZATRIPTAN BENZOATE 5 MG PO TABS: ORAL_TABLET | ORAL | 0 refills | Status: DC

## 2016-07-29 NOTE — Telephone Encounter (Signed)
Do you want to refer to GYN or see her?

## 2016-07-29 NOTE — Telephone Encounter (Signed)
Needs to see me

## 2016-07-29 NOTE — Progress Notes (Signed)
   Subjective:  Patient ID: Jenna Davidson, female    DOB: [birth year]  Age: 61 y.o. MRN: [id number]  CC: Vaginal discharge  HPI:  61 year old female presents with vaginal discharge.  Patient reports a one-month history of yellow vaginal discharge. No associated itching or odor. No dysuria. She's had the same sexual partner for the past 3 years. She endorses monogamy. No pelvic pain. She has had some recent dyspareunia but this has since resolved. Last time she had intercourse was approximately 2 weeks ago. She is in need of Pap smear. No other complaints or concerns this time.  Social Hx   Social History   Social History  . Marital status: Widowed    Spouse name: N/A  . Number of children: N/A  . Years of education: N/A   Social History Main Topics  . Smoking status: Never Smoker  . Smokeless tobacco: Never Used  . Alcohol use 0.0 - 0.6 oz/week  . Drug use: No  . Sexual activity: Not Asked   Other Topics Concern  . None   Social History Narrative  . None    Review of Systems  Constitutional: Negative.   Genitourinary: Positive for vaginal discharge. Negative for dysuria, pelvic pain and vaginal bleeding.   Objective:  BP 122/74 (BP Location: Left Arm, Patient Position: Sitting, Cuff Size: Normal)   Pulse 81   Temp 98 F (36.7 C) (Oral)   Resp 16   Wt 138 lb (62.6 kg)   SpO2 98%   BMI 21.61 kg/m   BP/Weight 07/29/2016 11/17/2015 09/10/9468  Systolic BP 962 836 629  Diastolic BP 74 78 85  Wt. (Lbs) 138 145.5 142.38  BMI 21.61 22.79 22.3   Physical Exam  Constitutional: She is oriented to person, place, and time. She appears well-developed. No distress.  Cardiovascular: Normal rate and regular rhythm.   Pulmonary/Chest: Effort normal and breath sounds normal.  Genitourinary:  Genitourinary Comments: Pelvic Exam: External: normal female genitalia without lesions or masses Vagina: Yellow vaginal discharge noted. Cervix: Numerous patches of erythema, discharge noted  from the os. Pap smear: performed.   Neurological: She is alert and oriented to person, place, and time.  Psychiatric: She has a normal mood and affect.  Vitals reviewed.   Lab Results  Component Value Date   WBC 4.3 07/31/2015   HGB 12.9 07/31/2015   HCT 39.3 07/31/2015   PLT 253.0 07/31/2015   GLUCOSE 93 07/31/2015   CHOL 227 (H) 07/31/2015   TRIG 77.0 07/31/2015   HDL 57.80 07/31/2015   LDLCALC 154 (H) 07/31/2015   ALT 14 07/31/2015   AST 18 07/31/2015   NA 140 07/31/2015   K 4.3 07/31/2015   CL 104 07/31/2015   CREATININE 0.71 07/31/2015   BUN 18 07/31/2015   CO2 30 07/31/2015   TSH 1.69 07/31/2015   HGBA1C 5.3 11/25/2013    Assessment & Plan:   Problem List Items Addressed This Visit      Other   Vaginal discharge - Primary    New problem. Does not appear to be consistent with yeast vaginitis. Possible STD given exam. Will await studies.      Relevant Orders   Cytology - PAP    Other Visit Diagnoses    Cervical cancer screening       Relevant Orders   Cytology - PAP     Follow-up: PRN  Thersa Salt DO [facility]

## 2016-07-29 NOTE — Telephone Encounter (Signed)
Please advise for refills, both Rx's pending LAst OV was 11/17/15 for an acute visit and has upcoming appt in September.  Advise for GYN or can you see?

## 2016-07-29 NOTE — Assessment & Plan Note (Addendum)
New problem. Does not appear to be consistent with yeast vaginitis. Possible STD given exam. Will await studies.

## 2016-07-30 ENCOUNTER — Other Ambulatory Visit: Payer: Self-pay | Admitting: Family Medicine

## 2016-07-30 LAB — CERVICOVAGINAL ANCILLARY ONLY: WET PREP (BD AFFIRM): POSITIVE — AB

## 2016-07-30 MED ORDER — METRONIDAZOLE 500 MG PO TABS: 500.0000 mg | ORAL_TABLET | Freq: Two times a day (BID) | ORAL | 0 refills | Status: DC

## 2016-08-01 ENCOUNTER — Encounter: Payer: Self-pay | Admitting: Family Medicine

## 2016-08-02 ENCOUNTER — Telehealth: Payer: Self-pay

## 2016-08-02 DIAGNOSIS — Z1231 Encounter for screening mammogram for malignant neoplasm of breast: Secondary | ICD-10-CM

## 2016-08-02 LAB — CERVICOVAGINAL ANCILLARY ONLY: HERPES (WINDOWPATH): NEGATIVE

## 2016-08-02 NOTE — Telephone Encounter (Signed)
Left message to call back in regards to appointment for mammogram .  Appointment scheduled 08/19/16 @11 :20 am .

## 2016-08-02 NOTE — Telephone Encounter (Signed)
Cytology department called stated Jenna Davidson STAT Pap was low grade and her G/C was negative, but HPV was high risk so further testing was ordered. Still waiting on further results for HPV and will send out ALL reports in one report once test comes back.

## 2016-08-03 ENCOUNTER — Other Ambulatory Visit: Payer: Self-pay | Admitting: Family Medicine

## 2016-08-03 ENCOUNTER — Encounter: Payer: Self-pay | Admitting: *Deleted

## 2016-08-03 LAB — CYTOLOGY - PAP
CHLAMYDIA, DNA PROBE: NEGATIVE
HPV 16/18/45 genotyping: NEGATIVE
HPV: DETECTED — AB
NEISSERIA GONORRHEA: NEGATIVE

## 2016-08-03 MED ORDER — METRONIDAZOLE 500 MG PO TABS: 500.0000 mg | ORAL_TABLET | Freq: Two times a day (BID) | ORAL | 0 refills | Status: DC

## 2016-08-19 ENCOUNTER — Other Ambulatory Visit: Payer: Self-pay | Admitting: Family Medicine

## 2016-08-19 ENCOUNTER — Ambulatory Visit
Admission: RE | Admit: 2016-08-19 | Discharge: 2016-08-19 | Disposition: A | Discharge status: Home / Self Care | Payer: BLUE CROSS/BLUE SHIELD | Source: Ambulatory Visit | Attending: Family Medicine | Admitting: Family Medicine

## 2016-08-19 DIAGNOSIS — Z1231 Encounter for screening mammogram for malignant neoplasm of breast: Secondary | ICD-10-CM | POA: Diagnosis not present

## 2016-08-27 ENCOUNTER — Other Ambulatory Visit: Payer: Self-pay | Admitting: *Deleted

## 2016-08-27 ENCOUNTER — Inpatient Hospital Stay
Admission: RE | Admit: 2016-08-27 | Discharge: 2016-08-27 | Disposition: A | Discharge status: Home / Self Care | Payer: Self-pay | Source: Ambulatory Visit | Attending: *Deleted | Admitting: *Deleted

## 2016-08-27 DIAGNOSIS — Z9289 Personal history of other medical treatment: Secondary | ICD-10-CM

## 2016-11-10 ENCOUNTER — Encounter: Payer: Self-pay | Admitting: Family Medicine

## 2016-11-10 ENCOUNTER — Ambulatory Visit (INDEPENDENT_AMBULATORY_CARE_PROVIDER_SITE_OTHER): Payer: BLUE CROSS/BLUE SHIELD | Admitting: Family Medicine

## 2016-11-10 VITALS — BP 120/84 | HR 65 | Temp 98.5°F | Ht 66.5 in | Wt 142.4 lb

## 2016-11-10 DIAGNOSIS — Z0000 Encounter for general adult medical examination without abnormal findings: Secondary | ICD-10-CM | POA: Insufficient documentation

## 2016-11-10 DIAGNOSIS — Z1159 Encounter for screening for other viral diseases: Secondary | ICD-10-CM

## 2016-11-10 DIAGNOSIS — Z23 Encounter for immunization: Secondary | ICD-10-CM

## 2016-11-10 DIAGNOSIS — E785 Hyperlipidemia, unspecified: Secondary | ICD-10-CM

## 2016-11-10 DIAGNOSIS — Z130 Encounter for screening for diseases of the blood and blood-forming organs and certain disorders involving the immune mechanism: Secondary | ICD-10-CM | POA: Diagnosis not present

## 2016-11-10 DIAGNOSIS — Z0001 Encounter for general adult medical examination with abnormal findings: Secondary | ICD-10-CM

## 2016-11-10 DIAGNOSIS — R87612 Low grade squamous intraepithelial lesion on cytologic smear of cervix (LGSIL): Secondary | ICD-10-CM

## 2016-11-10 DIAGNOSIS — E890 Postprocedural hypothyroidism: Secondary | ICD-10-CM | POA: Diagnosis not present

## 2016-11-10 LAB — CBC
HCT: 38.9 % (ref 36.0–46.0)
Hemoglobin: 12.9 g/dL (ref 12.0–15.0)
MCHC: 33.2 g/dL (ref 30.0–36.0)
MCV: 94.3 fl (ref 78.0–100.0)
PLATELETS: 216 10*3/uL (ref 150.0–400.0)
RBC: 4.12 Mil/uL (ref 3.87–5.11)
RDW: 13.5 % (ref 11.5–15.5)
WBC: 3.7 10*3/uL — ABNORMAL LOW (ref 4.0–10.5)

## 2016-11-10 LAB — COMPREHENSIVE METABOLIC PANEL
ALT: 13 U/L (ref 0–35)
AST: 17 U/L (ref 0–37)
Albumin: 4.3 g/dL (ref 3.5–5.2)
Alkaline Phosphatase: 53 U/L (ref 39–117)
BILIRUBIN TOTAL: 0.3 mg/dL (ref 0.2–1.2)
BUN: 20 mg/dL (ref 6–23)
CALCIUM: 9.7 mg/dL (ref 8.4–10.5)
CHLORIDE: 105 meq/L (ref 96–112)
CO2: 29 meq/L (ref 19–32)
CREATININE: 0.81 mg/dL (ref 0.40–1.20)
GFR: 76.84 mL/min (ref >60.00)
GLUCOSE: 100 mg/dL — AB (ref 70–99)
Potassium: 4.4 mEq/L (ref 3.5–5.1)
Sodium: 140 mEq/L (ref 135–145)
Total Protein: 7 g/dL (ref 6.0–8.3)

## 2016-11-10 LAB — LIPID PANEL
CHOL/HDL RATIO: 3
Cholesterol: 235 mg/dL — ABNORMAL HIGH (ref 0–200)
HDL: 67.9 mg/dL (ref >39.00)
LDL CALC: 152 mg/dL — AB (ref 0–99)
NONHDL: 167.01
TRIGLYCERIDES: 76 mg/dL (ref 0.0–149.0)
VLDL: 15.2 mg/dL (ref 0.0–40.0)

## 2016-11-10 LAB — TSH: TSH: 5 u[IU]/mL — ABNORMAL HIGH (ref 0.35–4.50)

## 2016-11-10 MED ORDER — RIZATRIPTAN BENZOATE 5 MG PO TABS: ORAL_TABLET | ORAL | 6 refills | Status: DC

## 2016-11-10 MED ORDER — SYNTHROID 112 MCG PO TABS: 112.0000 ug | ORAL_TABLET | Freq: Every day | ORAL | 1 refills | Status: DC

## 2016-11-10 MED ORDER — CYCLOBENZAPRINE HCL 5 MG PO TABS: 5.0000 mg | ORAL_TABLET | Freq: Three times a day (TID) | ORAL | 3 refills | Status: DC | PRN

## 2016-11-10 MED ORDER — LORAZEPAM 0.5 MG PO TABS: 0.5000 mg | ORAL_TABLET | ORAL | 0 refills | Status: DC | PRN

## 2016-11-10 NOTE — Assessment & Plan Note (Signed)
Referring to GYN given LSIL. Mammogram up-to-date. Colonoscopy up-to-date. Labs today including hepatitis C screening. She declines HIV screening. Flu vaccine today.

## 2016-11-10 NOTE — Patient Instructions (Signed)
We will call with the referral.  Best of luck.  Take care  Dr. Lacinda Axon  Health Maintenance, Female Adopting a healthy lifestyle and getting preventive care can go a long way to promote health and wellness. Talk with your health care provider about what schedule of regular examinations is right for you. This is a good chance for you to check in with your provider about disease prevention and staying healthy. In between checkups, there are plenty of things you can do on your own. Experts have done a lot of research about which lifestyle changes and preventive measures are most likely to keep you healthy. Ask your health care provider for more information. Weight and diet Eat a healthy diet  Be sure to include plenty of vegetables, fruits, low-fat dairy products, and lean protein.  Do not eat a lot of foods high in solid fats, added sugars, or salt.  Get regular exercise. This is one of the most important things you can do for your health. ? Most adults should exercise for at least 150 minutes each week. The exercise should increase your heart rate and make you sweat (moderate-intensity exercise). ? Most adults should also do strengthening exercises at least twice a week. This is in addition to the moderate-intensity exercise.  Maintain a healthy weight  Body mass index (BMI) is a measurement that can be used to identify possible weight problems. It estimates body fat based on height and weight. Your health care provider can help determine your BMI and help you achieve or maintain a healthy weight.  For females 46 years of age and older: ? A BMI below 18.5 is considered underweight. ? A BMI of 18.5 to 24.9 is normal. ? A BMI of 25 to 29.9 is considered overweight. ? A BMI of 30 and above is considered obese.  Watch levels of cholesterol and blood lipids  You should start having your blood tested for lipids and cholesterol at 59 years of age, then have this test every 5 years.  You may  need to have your cholesterol levels checked more often if: ? Your lipid or cholesterol levels are high. ? You are older than 59 years of age. ? You are at high risk for heart disease.  Cancer screening Lung Cancer  Lung cancer screening is recommended for adults 53-97 years old who are at high risk for lung cancer because of a history of smoking.  A yearly low-dose CT scan of the lungs is recommended for people who: ? Currently smoke. ? Have quit within the past 15 years. ? Have at least a 30-pack-year history of smoking. A pack year is smoking an average of one pack of cigarettes a day for 1 year.  Yearly screening should continue until it has been 15 years since you quit.  Yearly screening should stop if you develop a health problem that would prevent you from having lung cancer treatment.  Breast Cancer  Practice breast self-awareness. This means understanding how your breasts normally appear and feel.  It also means doing regular breast self-exams. Let your health care provider know about any changes, no matter how small.  If you are in your 20s or 30s, you should have a clinical breast exam (CBE) by a health care provider every 1-3 years as part of a regular health exam.  If you are 79 or older, have a CBE every year. Also consider having a breast X-ray (mammogram) every year.  If you have a family history of breast  cancer, talk to your health care provider about genetic screening.  If you are at high risk for breast cancer, talk to your health care provider about having an MRI and a mammogram every year.  Breast cancer gene (BRCA) assessment is recommended for women who have family members with BRCA-related cancers. BRCA-related cancers include: ? Breast. ? Ovarian. ? Tubal. ? Peritoneal cancers.  Results of the assessment will determine the need for genetic counseling and BRCA1 and BRCA2 testing.  Cervical Cancer Your health care provider may recommend that you be  screened regularly for cancer of the pelvic organs (ovaries, uterus, and vagina). This screening involves a pelvic examination, including checking for microscopic changes to the surface of your cervix (Pap test). You may be encouraged to have this screening done every 3 years, beginning at age 35.  For women ages 30-65, health care providers may recommend pelvic exams and Pap testing every 3 years, or they may recommend the Pap and pelvic exam, combined with testing for human papilloma virus (HPV), every 5 years. Some types of HPV increase your risk of cervical cancer. Testing for HPV may also be done on women of any age with unclear Pap test results.  Other health care providers may not recommend any screening for nonpregnant women who are considered low risk for pelvic cancer and who do not have symptoms. Ask your health care provider if a screening pelvic exam is right for you.  If you have had past treatment for cervical cancer or a condition that could lead to cancer, you need Pap tests and screening for cancer for at least 20 years after your treatment. If Pap tests have been discontinued, your risk factors (such as having a new sexual partner) need to be reassessed to determine if screening should resume. Some women have medical problems that increase the chance of getting cervical cancer. In these cases, your health care provider may recommend more frequent screening and Pap tests.  Colorectal Cancer  This type of cancer can be detected and often prevented.  Routine colorectal cancer screening usually begins at 58 years of age and continues through 59 years of age.  Your health care provider may recommend screening at an earlier age if you have risk factors for colon cancer.  Your health care provider may also recommend using home test kits to check for hidden blood in the stool.  A small camera at the end of a tube can be used to examine your colon directly (sigmoidoscopy or colonoscopy).  This is done to check for the earliest forms of colorectal cancer.  Routine screening usually begins at age 45.  Direct examination of the colon should be repeated every 5-10 years through 59 years of age. However, you may need to be screened more often if early forms of precancerous polyps or small growths are found.  Skin Cancer  Check your skin from head to toe regularly.  Tell your health care provider about any new moles or changes in moles, especially if there is a change in a mole's shape or color.  Also tell your health care provider if you have a mole that is larger than the size of a pencil eraser.  Always use sunscreen. Apply sunscreen liberally and repeatedly throughout the day.  Protect yourself by wearing long sleeves, pants, a wide-brimmed hat, and sunglasses whenever you are outside.  Heart disease, diabetes, and high blood pressure  High blood pressure causes heart disease and increases the risk of stroke. High blood pressure  is more likely to develop in: ? People who have blood pressure in the high end of the normal range (130-139/85-89 mm Hg). ? People who are overweight or obese. ? People who are African American.  If you are 71-68 years of age, have your blood pressure checked every 3-5 years. If you are 27 years of age or older, have your blood pressure checked every year. You should have your blood pressure measured twice-once when you are at a hospital or clinic, and once when you are not at a hospital or clinic. Record the average of the two measurements. To check your blood pressure when you are not at a hospital or clinic, you can use: ? An automated blood pressure machine at a pharmacy. ? A home blood pressure monitor.  If you are between 3 years and 42 years old, ask your health care provider if you should take aspirin to prevent strokes.  Have regular diabetes screenings. This involves taking a blood sample to check your fasting blood sugar level. ? If  you are at a normal weight and have a low risk for diabetes, have this test once every three years after 59 years of age. ? If you are overweight and have a high risk for diabetes, consider being tested at a younger age or more often. Preventing infection Hepatitis B  If you have a higher risk for hepatitis B, you should be screened for this virus. You are considered at high risk for hepatitis B if: ? You were born in a country where hepatitis B is common. Ask your health care provider which countries are considered high risk. ? Your parents were born in a high-risk country, and you have not been immunized against hepatitis B (hepatitis B vaccine). ? You have HIV or AIDS. ? You use needles to inject street drugs. ? You live with someone who has hepatitis B. ? You have had sex with someone who has hepatitis B. ? You get hemodialysis treatment. ? You take certain medicines for conditions, including cancer, organ transplantation, and autoimmune conditions.  Hepatitis C  Blood testing is recommended for: ? Everyone born from [birth year] through 1963. ? Anyone with known risk factors for hepatitis C.  Sexually transmitted infections (STIs)  You should be screened for sexually transmitted infections (STIs) including gonorrhea and chlamydia if: ? You are sexually active and are younger than 60 years of age. ? You are older than 59 years of age and your health care provider tells you that you are at risk for this type of infection. ? Your sexual activity has changed since you were last screened and you are at an increased risk for chlamydia or gonorrhea. Ask your health care provider if you are at risk.  If you do not have HIV, but are at risk, it may be recommended that you take a prescription medicine daily to prevent HIV infection. This is called pre-exposure prophylaxis (PrEP). You are considered at risk if: ? You are sexually active and do not regularly use condoms or know the HIV status of your  partner(s). ? You take drugs by injection. ? You are sexually active with a partner who has HIV.  Talk with your health care provider about whether you are at high risk of being infected with HIV. If you choose to begin PrEP, you should first be tested for HIV. You should then be tested every 3 months for as long as you are taking PrEP. Pregnancy  If you are premenopausal and  you may become pregnant, ask your health care provider about preconception counseling.  If you may become pregnant, take 400 to 800 micrograms (mcg) of folic acid every day.  If you want to prevent pregnancy, talk to your health care provider about birth control (contraception). Osteoporosis and menopause  Osteoporosis is a disease in which the bones lose minerals and strength with aging. This can result in serious bone fractures. Your risk for osteoporosis can be identified using a bone density scan.  If you are 59 years of age or older, or if you are at risk for osteoporosis and fractures, ask your health care provider if you should be screened.  Ask your health care provider whether you should take a calcium or vitamin D supplement to lower your risk for osteoporosis.  Menopause may have certain physical symptoms and risks.  Hormone replacement therapy may reduce some of these symptoms and risks. Talk to your health care provider about whether hormone replacement therapy is right for you. Follow these instructions at home:  Schedule regular health, dental, and eye exams.  Stay current with your immunizations.  Do not use any tobacco products including cigarettes, chewing tobacco, or electronic cigarettes.  If you are pregnant, do not drink alcohol.  If you are breastfeeding, limit how much and how often you drink alcohol.  Limit alcohol intake to no more than 1 drink per day for nonpregnant women. One drink equals 12 ounces of beer, 5 ounces of wine, or 1 ounces of hard liquor.  Do not use street  drugs.  Do not share needles.  Ask your health care provider for help if you need support or information about quitting drugs.  Tell your health care provider if you often feel depressed.  Tell your health care provider if you have ever been abused or do not feel safe at home. This information is not intended to replace advice given to you by your health care provider. Make sure you discuss any questions you have with your health care provider. Document Released: 08/24/2010 Document Revised: 07/17/2015 Document Reviewed: 11/12/2014 Elsevier Interactive Patient Education  [employer].

## 2016-11-10 NOTE — Progress Notes (Signed)
Subjective:  Patient ID: Jenna Davidson, female    DOB: 03-28-1955  Age: 61 y.o. MRN: [id number]  CC: Annual physical  HPI Sherece Gambrill is a 61 y.o. female presents to the clinic today for an annual physical.  Preventative Healthcare  Pap smear: Up to date. LSIL (07/29/16); Could not reach patient to give result. It was sent in mail. Has not seen GYN. Needs referral.   Mammogram: Up to date.   Colonoscopy: Up to date.   Immunizations  Tetanus - Up to date.   Flu - In need of.  Hepatitis C screening - Screening today.   Labs: Labs today.  Alcohol use: See below.  Smoking/tobacco use: No.  STD/HIV testing: Declines  PMH, Surgical Hx, Family Hx, Social History reviewed and updated as below.  Past Medical History:  Diagnosis Date  . Allergy   . Cancer ([place]) 4193   follicular papillary carcinoma; s/p thyroidectomy  . Chicken pox   . Heart murmur   . Hypothyroidism   . Kidney stones   . Migraines    Past Surgical History:  Procedure Laterality Date  . AUGMENTATION MAMMAPLASTY Bilateral 2004   saline implants. left breast sits higher than the right.   [facility] BREAST ENHANCEMENT SURGERY  2006   breast augmentation   . TONSILLECTOMY AND ADENOIDECTOMY  1967   Family History  Problem Relation Age of Onset  . Alcohol abuse Father   . Lung cancer Father   . Heart disease Father   . Diabetes Maternal Grandmother   . Diabetes Maternal Grandfather   . Uterine cancer Paternal Grandmother   . Heart disease Paternal Grandmother   . Heart disease Paternal Grandfather   . Stroke Paternal Grandfather    Social History  Substance Use Topics  . Smoking status: Never Smoker  . Smokeless tobacco: Never Used  . Alcohol use 0.0 - 0.6 oz/week   Review of Systems  Genitourinary: Positive for frequency.       Sexual difficulty.  Musculoskeletal:       Joint pain, myalgias.  Neurological: Positive for headaches.  Psychiatric/Behavioral:       Sadness, anxiety.  All other  systems reviewed and are negative.   Objective:   Today's Vitals: BP 120/84 (BP Location: Left Arm, Patient Position: Sitting, Cuff Size: Normal)   Pulse 65   Temp 98.5 F (36.9 C) (Oral)   Ht 5' 6.5" (1.689 m)   Wt 142 lb 6 oz (64.6 kg)   SpO2 98%   BMI 22.64 kg/m   Physical Exam  Constitutional: She is oriented to person, place, and time. She appears well-developed and well-nourished. No distress.  HENT:  Head: Normocephalic and atraumatic.  Nose: Nose normal.  Mouth/Throat: Oropharynx is clear and moist. No oropharyngeal exudate.  Normal TM's bilaterally.   Eyes: Conjunctivae are normal. No scleral icterus.  Neck: Neck supple.  Cardiovascular: Normal rate and regular rhythm.   No murmur heard. Pulmonary/Chest: Effort normal and breath sounds normal. She has no wheezes. She has no rales.  Abdominal: Soft. She exhibits no distension. There is no tenderness. There is no rebound and no guarding.  Musculoskeletal: Normal range of motion. She exhibits no edema.  Lymphadenopathy:    She has no cervical adenopathy.  Neurological: She is alert and oriented to person, place, and time.  Skin: Skin is warm and dry. No rash noted.  Psychiatric: She has a normal mood and affect.  Vitals reviewed.  Assessment & Plan:   Problem List Items Addressed  This Visit    Encounter for health maintenance examination with abnormal findings - Primary    Referring to GYN given LSIL. Mammogram up-to-date. Colonoscopy up-to-date. Labs today including hepatitis C screening. She declines HIV screening. Flu vaccine today.      Hyperlipidemia   Relevant Orders   Lipid panel   Postoperative hypothyroidism   Relevant Medications   SYNTHROID 112 MCG tablet   Other Relevant Orders   Comprehensive metabolic panel   TSH    Other Visit Diagnoses    Low grade squamous intraepithelial lesion (LGSIL) on cervical Pap smear       Relevant Orders   Ambulatory referral to Gynecology   Screening for  deficiency anemia       Relevant Orders   CBC   Need for hepatitis C screening test       Relevant Orders   Hepatitis C Antibody   Need for immunization against influenza       Relevant Orders   Flu Vaccine QUAD 36+ mos IM (Completed)      Meds ordered this encounter  Medications  . rizatriptan (MAXALT) 5 MG tablet    Sig: 1-2 tablets at onset of migraine. May repeat in 2 hours (once)    Dispense:  20 tablet    Refill:  6  . cyclobenzaprine (FLEXERIL) 5 MG tablet    Sig: Take 1 tablet (5 mg total) by mouth 3 (three) times daily as needed for muscle spasms.    Dispense:  30 tablet    Refill:  3  . LORazepam (ATIVAN) 0.5 MG tablet    Sig: Take 1 tablet (0.5 mg total) by mouth as needed for anxiety.    Dispense:  30 tablet    Refill:  0  . SYNTHROID 112 MCG tablet    Sig: Take 1 tablet (112 mcg total) by mouth daily.    Dispense:  90 tablet    Refill:  1   Follow-up: Return in about 1 year (around 11/10/2017).  [place]

## 2016-11-11 ENCOUNTER — Telehealth: Payer: Self-pay

## 2016-11-11 ENCOUNTER — Other Ambulatory Visit: Payer: Self-pay | Admitting: Family Medicine

## 2016-11-11 LAB — HEPATITIS C ANTIBODY
Hepatitis C Ab: NONREACTIVE
SIGNAL TO CUT-OFF: 0.03 (ref <1.00)

## 2016-11-11 MED ORDER — LEVOTHYROXINE SODIUM 125 MCG PO TABS: 125.0000 ug | ORAL_TABLET | Freq: Every day | ORAL | 0 refills | Status: DC

## 2016-11-11 NOTE — Telephone Encounter (Signed)
error 

## 2017-02-24 ENCOUNTER — Other Ambulatory Visit: Payer: Self-pay | Admitting: Family Medicine

## 2017-02-24 MED ORDER — LEVOTHYROXINE SODIUM 125 MCG PO TABS: 125.0000 ug | ORAL_TABLET | Freq: Every day | ORAL | 0 refills | Status: DC

## 2017-02-24 NOTE — Telephone Encounter (Signed)
Copied from [facility] [place]. Topic: Quick Communication - Rx Refill/Question >> Feb 24, 2017  3:56 PM Jenna Davidson wrote: Pt called to get refills on her Rx's of levothyroxine (SYNTHROID) 125 MCG tablet [[id number]] and promethazine (PHENERGAN) 25 MG tablet [[id number]], contact pt to advise or contact pharmacy

## 2017-02-24 NOTE — Telephone Encounter (Signed)
Synthroid sent to pharmacy.  Please see what she takes the promethazine for.  It also appears Dr. Lacinda Axon referred her to gynecology previously.  Please see if she followed up with them.  Thanks.

## 2017-02-24 NOTE — Telephone Encounter (Signed)
Last OV 11/10/16 with Dr.Cook last filled  Levothyroxine 11/11/16 90 0rf Promethazine under historical

## 2017-02-24 NOTE — Telephone Encounter (Signed)
Please advise 

## 2017-02-25 NOTE — Telephone Encounter (Signed)
Left message to return call to ask patient what she takes promethazine for and if she saw gynecology as recommended by Dr.Cook. [place] for pec to speak to patient

## 2017-05-24 ENCOUNTER — Other Ambulatory Visit: Payer: Self-pay | Admitting: Family Medicine

## 2017-10-25 ENCOUNTER — Other Ambulatory Visit: Payer: Self-pay | Admitting: Family Medicine

## 2017-10-27 ENCOUNTER — Other Ambulatory Visit: Payer: Self-pay | Admitting: Family Medicine

## 2017-10-28 ENCOUNTER — Other Ambulatory Visit: Payer: Self-pay | Admitting: Family Medicine

## 2017-10-31 ENCOUNTER — Other Ambulatory Visit: Payer: Self-pay | Admitting: Family Medicine

## 2017-11-11 ENCOUNTER — Encounter: Payer: Self-pay | Admitting: Family Medicine

## 2017-11-11 ENCOUNTER — Ambulatory Visit (INDEPENDENT_AMBULATORY_CARE_PROVIDER_SITE_OTHER): Payer: BLUE CROSS/BLUE SHIELD | Admitting: Family Medicine

## 2017-11-11 VITALS — BP 120/78 | HR 60 | Temp 97.9°F | Ht 66.25 in | Wt 141.2 lb

## 2017-11-11 DIAGNOSIS — E785 Hyperlipidemia, unspecified: Secondary | ICD-10-CM | POA: Diagnosis not present

## 2017-11-11 DIAGNOSIS — R87612 Low grade squamous intraepithelial lesion on cytologic smear of cervix (LGSIL): Secondary | ICD-10-CM | POA: Diagnosis not present

## 2017-11-11 DIAGNOSIS — Z0001 Encounter for general adult medical examination with abnormal findings: Secondary | ICD-10-CM

## 2017-11-11 DIAGNOSIS — Z130 Encounter for screening for diseases of the blood and blood-forming organs and certain disorders involving the immune mechanism: Secondary | ICD-10-CM

## 2017-11-11 DIAGNOSIS — Z23 Encounter for immunization: Secondary | ICD-10-CM

## 2017-11-11 DIAGNOSIS — G43909 Migraine, unspecified, not intractable, without status migrainosus: Secondary | ICD-10-CM | POA: Insufficient documentation

## 2017-11-11 DIAGNOSIS — E890 Postprocedural hypothyroidism: Secondary | ICD-10-CM

## 2017-11-11 HISTORY — DX: Low grade squamous intraepithelial lesion on cytologic smear of cervix (LGSIL): R87.612

## 2017-11-11 LAB — CBC
HEMATOCRIT: 38.2 % (ref 36.0–46.0)
Hemoglobin: 12.7 g/dL (ref 12.0–15.0)
MCHC: 33.3 g/dL (ref 30.0–36.0)
MCV: 92.9 fl (ref 78.0–100.0)
Platelets: 253 10*3/uL (ref 150.0–400.0)
RBC: 4.11 Mil/uL (ref 3.87–5.11)
RDW: 13.3 % (ref 11.5–15.5)
WBC: 4.4 10*3/uL (ref 4.0–10.5)

## 2017-11-11 LAB — COMPREHENSIVE METABOLIC PANEL
ALT: 13 U/L (ref 0–35)
AST: 17 U/L (ref 0–37)
Albumin: 4.3 g/dL (ref 3.5–5.2)
Alkaline Phosphatase: 49 U/L (ref 39–117)
BUN: 17 mg/dL (ref 6–23)
CHLORIDE: 107 meq/L (ref 96–112)
CO2: 28 mEq/L (ref 19–32)
Calcium: 9.5 mg/dL (ref 8.4–10.5)
Creatinine, Ser: 0.72 mg/dL (ref 0.40–1.20)
GFR: 87.73 mL/min (ref >60.00)
GLUCOSE: 91 mg/dL (ref 70–99)
POTASSIUM: 3.8 meq/L (ref 3.5–5.1)
SODIUM: 142 meq/L (ref 135–145)
TOTAL PROTEIN: 7.2 g/dL (ref 6.0–8.3)
Total Bilirubin: 0.5 mg/dL (ref 0.2–1.2)

## 2017-11-11 LAB — LIPID PANEL
CHOL/HDL RATIO: 3
Cholesterol: 214 mg/dL — ABNORMAL HIGH (ref 0–200)
HDL: 64.1 mg/dL (ref >39.00)
LDL CALC: 138 mg/dL — AB (ref 0–99)
NONHDL: 150.37
Triglycerides: 64 mg/dL (ref 0.0–149.0)
VLDL: 12.8 mg/dL (ref 0.0–40.0)

## 2017-11-11 LAB — TSH: TSH: 0.69 u[IU]/mL (ref 0.35–4.50)

## 2017-11-11 NOTE — Assessment & Plan Note (Signed)
Check TSH 

## 2017-11-11 NOTE — Assessment & Plan Note (Signed)
Only occasionally has symptoms.  Well managed by Maxalt.  She will continue to monitor and if headache frequency increases or she develops new headaches she will let us know.

## 2017-11-11 NOTE — Patient Instructions (Signed)
Nice to meet you. Somebody should contact you regarding seeing gynecology. Please continue to stay active and monitor your diet. We will contact you with your lab results.

## 2017-11-11 NOTE — Progress Notes (Signed)
Tommi Rumps, MD Phone: [phone]  Jenna Davidson is a 62 y.o. female who presents today for CPE.  Exercises by walking, biking, hiking, and going to boot camp. Diet is fairly healthy.  Does have soda once a week.  No sweet tea.  Needs to drink more water. Patient has been doing her mammograms every 2 years and would prefer to stick to this plan.  Prior mammogram unremarkable. Pap smear completed 07/29/2016.  Was LGSIL with positive HPV.  She was referred to gynecology though never saw them.  She reports she got a call from them though deleted the message and never called them back. Colonoscopy up-to-date.  We will request records. Tetanus vaccination up-to-date.  Due for Shingrix.  Due for flu shot. Hepatitis C up-to-date. No tobacco use or illicit drug use.  Social alcohol use. Sees a dentist twice a year.  Due for ophthalmology visit.  Patient has chronic history of migraines.  Has not bothered her in the last month.  Typically only bothers her if she does not eat soon enough.  Described as pain behind her right eye.  Has photophobia and phonophobia.  If she takes Maxalt soon enough it resolves her symptoms.  She also reports chronic issues with muscle and joint aches in her left leg that have been stable.  She has seen a chiropractor for them.   Active Ambulatory Problems    Diagnosis Date Noted  . Postoperative hypothyroidism 07/31/2015  . Hyperlipidemia 07/31/2015  . Anxiety 07/31/2015  . Encounter for health maintenance examination with abnormal findings 11/10/2016  . 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 Migraine 11/11/2017   Resolved Ambulatory Problems    Diagnosis Date Noted  . Encounter for preventative adult health care exam with abnormal findings 07/31/2015  . Bilateral hand pain 07/31/2015  . Post-nasal drip 11/05/2015  . Vaginal discharge 07/29/2016   Past Medical History:  Diagnosis Date  . Allergy   . Cancer ([place]) 1983  . Chicken pox   . Heart murmur    . Hypothyroidism   . Kidney stones   . Migraines     Family History  Problem Relation Age of Onset  . Alcohol abuse Father   . Lung cancer Father   . Heart disease Father   . Diabetes Maternal Grandmother   . Diabetes Maternal Grandfather   . Uterine cancer Paternal Grandmother   . Heart disease Paternal Grandmother   . Heart disease Paternal Grandfather   . Stroke Paternal Grandfather     Social History   Socioeconomic History  . Marital status: Widowed    Spouse name: Not on file  . Number of children: Not on file  . Years of education: Not on file  . Highest education level: Not on file  Occupational History  . Not on file  Social Needs  . Financial resource strain: Not on file  . Food insecurity:    Worry: Not on file    Inability: Not on file  . Transportation needs:    Medical: Not on file    Non-medical: Not on file  Tobacco Use  . Smoking status: Never Smoker  . Smokeless tobacco: Never Used  Substance and Sexual Activity  . Alcohol use: Yes    Alcohol/week: 0.0 - 1.0 standard drinks  . Drug use: No  . Sexual activity: Not on file  Lifestyle  . Physical activity:    Days per week: Not on file    Minutes per session: Not on file  .  Stress: Not on file  Relationships  . Social connections:    Talks on phone: Not on file    Gets together: Not on file    Attends religious service: Not on file    Active member of club or organization: Not on file    Attends meetings of clubs or organizations: Not on file    Relationship status: Not on file  . Intimate partner violence:    Fear of current or ex partner: Not on file    Emotionally abused: Not on file    Physically abused: Not on file    Forced sexual activity: Not on file  Other Topics Concern  . Not on file  Social History Narrative  . Not on file    ROS  General:  Negative for unexplained weight loss, fever Skin: Negative for new or changing mole, sore that won't heal HEENT: Negative for  trouble hearing, trouble seeing, ringing in ears, mouth sores, hoarseness, change in voice, dysphagia. CV:  Negative for chest pain, dyspnea, edema, palpitations Resp: Negative for cough, dyspnea, hemoptysis GI: Negative for nausea, vomiting, diarrhea, constipation, abdominal pain, melena, hematochezia. GU: Negative for dysuria, incontinence, urinary hesitance, hematuria, vaginal or penile discharge, polyuria, sexual difficulty, lumps in testicle or breasts MSK: Positive for muscle cramps or aches, negative for joint pain or swelling Neuro: Positive for headaches, negative for weakness, numbness, dizziness, passing out/fainting Psych: Negative for depression, anxiety, memory problems  Objective  Physical Exam Vitals:   11/11/17 0919  BP: 120/78  Pulse: 60  Temp: 97.9 F (36.6 C)  SpO2: 98%    BP Readings from Last 3 Encounters:  11/11/17 120/78  11/10/16 120/84  07/29/16 122/74   Wt Readings from Last 3 Encounters:  11/11/17 141 lb 3.2 oz (64 kg)  11/10/16 142 lb 6 oz (64.6 kg)  07/29/16 138 lb (62.6 kg)    Physical Exam  Constitutional: No distress.  HENT:  Head: Normocephalic and atraumatic.  Mouth/Throat: Oropharynx is clear and moist.  Eyes: Pupils are equal, round, and reactive to light. Conjunctivae are normal.  Neck: Neck supple.  Cardiovascular: Normal rate, regular rhythm and normal heart sounds.  Pulmonary/Chest: Effort normal and breath sounds normal.  Abdominal: Soft. Bowel sounds are normal. She exhibits no distension. There is no tenderness. There is no rebound and no guarding.  Genitourinary:  Genitourinary Comments: Chaperone used, breast exam with implants noted, no palpable masses, tenderness, nipple inversion, or skin changes, no axillary masses bilaterally  Musculoskeletal: She exhibits no edema.  Lymphadenopathy:    She has no cervical adenopathy.  Neurological: She is alert.  Skin: Skin is warm and dry. She is not diaphoretic.  Psychiatric: She  has a normal mood and affect.     Assessment/Plan:   Encounter for health maintenance examination with abnormal findings Physical exam completed.  She will continue diet and exercise.  GYN referral placed.  I advised her to watch out for the call so she can have this taken care of.  She would prefer to stick with mammograms every 2 years.  Will request colonoscopy records.  Shingrix given.  Flu shot given.  Lab work as outlined below.  Postoperative hypothyroidism Check TSH.  Hyperlipidemia Check lipid panel.  LGSIL on Pap smear of cervix She has been referred to gynecology for further evaluation of this.  Migraine Only occasionally has symptoms.  Well managed by Maxalt.  She will continue to monitor and if headache frequency increases or she develops new headaches she will  let us know.   Orders Placed This Encounter  Procedures  . Varicella-zoster vaccine IM (Shingrix)  . Flu Vaccine QUAD 6+ mos PF IM (Fluarix Quad PF)  . CBC  . Comp Met (CMET)  . Lipid panel  . TSH  . Ambulatory referral to Gynecology    Referral Priority:   Routine    Referral Type:   Consultation    Referral Reason:   Specialty Services Required    Requested Specialty:   Gynecology    Number of Visits Requested:   1    No orders of the defined types were placed in this encounter.    Tommi Rumps, MD [place]

## 2017-11-11 NOTE — Assessment & Plan Note (Signed)
Physical exam completed.  She will continue diet and exercise.  GYN referral placed.  I advised her to watch out for the call so she can have this taken care of.  She would prefer to stick with mammograms every 2 years.  Will request colonoscopy records.  Shingrix given.  Flu shot given.  Lab work as outlined below.

## 2017-11-11 NOTE — Assessment & Plan Note (Signed)
Check lipid panel  

## 2017-11-11 NOTE — Assessment & Plan Note (Signed)
She has been referred to gynecology for further evaluation of this.

## 2017-11-13 ENCOUNTER — Encounter: Payer: Self-pay | Admitting: Family Medicine

## 2017-11-14 ENCOUNTER — Telehealth: Payer: Self-pay | Admitting: Obstetrics & Gynecology

## 2017-11-14 NOTE — Telephone Encounter (Signed)
[facility] referring for LGSIL on Pap smear of cervix. Called and left voicemail for patient to call back to be schedule

## 2017-11-15 MED ORDER — RIZATRIPTAN BENZOATE 5 MG PO TABS: ORAL_TABLET | ORAL | 2 refills | Status: DC

## 2017-11-15 MED ORDER — LORAZEPAM 0.5 MG PO TABS: 0.5000 mg | ORAL_TABLET | ORAL | 0 refills | Status: DC | PRN

## 2017-11-15 MED ORDER — CYCLOBENZAPRINE HCL 5 MG PO TABS: 5.0000 mg | ORAL_TABLET | Freq: Three times a day (TID) | ORAL | 3 refills | Status: DC | PRN

## 2017-11-15 MED ORDER — LEVOTHYROXINE SODIUM 125 MCG PO TABS: 125.0000 ug | ORAL_TABLET | Freq: Every day | ORAL | 0 refills | Status: DC

## 2017-11-16 ENCOUNTER — Other Ambulatory Visit: Payer: Self-pay | Admitting: Family Medicine

## 2017-11-16 MED ORDER — PROMETHAZINE HCL 25 MG PO TABS: 25.0000 mg | ORAL_TABLET | Freq: Three times a day (TID) | ORAL | 0 refills | Status: DC | PRN

## 2017-11-16 NOTE — Addendum Note (Signed)
Addended by: Caryl Bis Javen Hinderliter G on: 11/16/2017 11:46 AM   Modules accepted: Orders

## 2017-11-16 NOTE — Telephone Encounter (Signed)
Called and left voicemail for patient to call back to be schedule. Will send message thru Mychart to contact patient .

## 2017-11-17 NOTE — Telephone Encounter (Signed)
rx refill

## 2017-11-18 NOTE — Telephone Encounter (Signed)
Unable to leave voicemail. Will send letter via mail

## 2017-12-01 ENCOUNTER — Encounter: Payer: Self-pay | Admitting: Family Medicine

## 2017-12-08 ENCOUNTER — Encounter: Payer: Self-pay | Admitting: Obstetrics and Gynecology

## 2017-12-08 ENCOUNTER — Other Ambulatory Visit (HOSPITAL_COMMUNITY)
Admission: RE | Admit: 2017-12-08 | Discharge: 2017-12-08 | Disposition: A | Discharge status: Home / Self Care | Payer: BLUE CROSS/BLUE SHIELD | Source: Ambulatory Visit | Attending: Obstetrics and Gynecology | Admitting: Obstetrics and Gynecology

## 2017-12-08 ENCOUNTER — Ambulatory Visit (INDEPENDENT_AMBULATORY_CARE_PROVIDER_SITE_OTHER): Payer: BLUE CROSS/BLUE SHIELD | Admitting: Obstetrics and Gynecology

## 2017-12-08 VITALS — BP 122/80 | HR 72 | Ht 66.5 in | Wt 142.0 lb

## 2017-12-08 DIAGNOSIS — N870 Mild cervical dysplasia: Secondary | ICD-10-CM | POA: Diagnosis not present

## 2017-12-08 DIAGNOSIS — N879 Dysplasia of cervix uteri, unspecified: Secondary | ICD-10-CM | POA: Diagnosis not present

## 2017-12-08 DIAGNOSIS — R87612 Low grade squamous intraepithelial lesion on cytologic smear of cervix (LGSIL): Secondary | ICD-10-CM | POA: Insufficient documentation

## 2017-12-08 NOTE — Patient Instructions (Signed)
COLPOSCOPY POST-PROCEDURE INSTRUCTIONS  1. You may take Ibuprofen, Aleve or Tylenol for cramping if needed.  2. If Monsel's solution was used, you will have a black discharge.  3. Light bleeding is normal.  If bleeding is heavier than your period, please call.  4. Put nothing in your vagina until the bleeding or discharge stops (usually 2 or3 days).  5. We will call you within one week with biopsy results or discuss the results at your follow-up appointment if needed.   Colposcopy, Care After This sheet gives you information about how to care for yourself after your procedure. Your doctor may also give you more specific instructions. If you have problems or questions, contact your doctor. What can I expect after the procedure? If you did not have a tissue sample removed (did not have a biopsy), you may only have some spotting for a few days. You can go back to your normal activities. If you had a tissue sample removed, it is common to have:  Soreness and pain. This may last for a few days.  Light-headedness.  Mild bleeding from your vagina or dark-colored, grainy discharge from your vagina. This may last for a few days. You may need to wear a sanitary pad.  Spotting for at least 48 hours after the procedure.  Follow these instructions at home:  Take over-the-counter and prescription medicines only as told by your doctor. Ask your doctor what medicines you can start taking again. This is very important if you take blood-thinning medicine.  Do not drive or use heavy machinery while taking prescription pain medicine.  For 3 days, or as long as your doctor tells you, avoid: ? Douching. ? Using tampons. ? Having sex.  If you use birth control (contraception), keep using it.  Limit activity for the first day after the procedure. Ask your doctor what activities are safe for you.  It is up to you to get the results of your procedure. Ask your doctor when your results will be  ready.  Keep all follow-up visits as told by your doctor. This is important. Contact a doctor if:  You get a skin rash. Get help right away if:  You are bleeding a lot from your vagina. It is a lot of bleeding if you are using more than one pad an hour for 2 hours in a row.  You have clumps of blood (blood clots) coming from your vagina.  You have a fever.  You have chills  You have pain in your lower belly (pelvic area).  You have signs of infection, such as vaginal discharge that is: ? Different than usual. ? Yellow. ? Bad-smelling.  You have very pain or cramps in your lower belly that do not get better with medicine.  You feel light-headed.  You feel dizzy.  You pass out (faint). Summary  If you did not have a tissue sample removed (did not have a biopsy), you may only have some spotting for a few days. You can go back to your normal activities.  If you had a tissue sample removed, it is common to have mild pain and spotting for 48 hours.  For 3 days, or as long as your doctor tells you, avoid douching, using tampons and having sex.  Get help right away if you have bleeding, very bad pain, or signs of infection. This information is not intended to replace advice given to you by your health care provider. Make sure you discuss any questions you have  with your health care provider. Document Released: 07/28/2007 Document Revised: 10/29/2015 Document Reviewed: 10/29/2015 Elsevier Interactive Patient Education  2018 [employer].

## 2017-12-08 NOTE — Progress Notes (Signed)
   GYNECOLOGY CLINIC COLPOSCOPY PROCEDURE NOTE  62 y.o. G1P1001 here for colposcopy for low-grade squamous intraepithelial neoplasia (LGSIL - encompassing HPV,mild dysplasia,CIN I)  pap smear on 07/2016. Discussed underlying role for HPV infection in the development of cervical dysplasia, its natural history and progression/regression, need for surveillance.  Is the patient  pregnant: No LMP: No LMP recorded. Patient is postmenopausal. Smoking status:  reports that she has never smoked. She has never used smokeless tobacco. Contraception: none Future fertility desired:  No  Patient given informed consent, signed copy in the chart, time out was performed.  The patient was position in dorsal lithotomy position. Speculum was placed the cervix was visualized.   After application of acetic acid colposcopic inspection of the cervix was undertaken.   Colposcopy adequate, full visualization of transformation zone: No no visible lesions; random 3 o'clock biopsy obtained.   ECC specimen obtained:  Yes  All specimens were labeled and sent to pathology.   Patient was given post procedure instructions.  Will follow up pathology and manage accordingly.  Routine preventative health maintenance measures emphasized.  Physical Exam  Genitourinary:       Adrian Prows MD [facility], [facility] 12/08/17 10:00 AM

## 2017-12-13 NOTE — Progress Notes (Signed)
Repeat pap smear in 1 year. Called phone number but no answer and voicemail was not set up. Released with note to mychart.

## 2017-12-27 ENCOUNTER — Telehealth: Payer: Self-pay | Admitting: Obstetrics and Gynecology

## 2017-12-27 NOTE — Telephone Encounter (Signed)
-----   Message from Jenna Fellers, MD sent at 12/27/2017  8:36 AM EST ----- Please call this patient and schedule her for an appointment with me , okay to double book but please not last patient of the day. Thank you, Dr. Gilman Schmidt

## 2017-12-27 NOTE — Telephone Encounter (Signed)
Called and left voice mail for patient to call back to be schedule °

## 2018-01-25 ENCOUNTER — Other Ambulatory Visit: Payer: Self-pay | Admitting: Family Medicine

## 2018-05-05 ENCOUNTER — Other Ambulatory Visit: Payer: Self-pay | Admitting: Family Medicine

## 2018-05-05 MED ORDER — PROMETHAZINE HCL 25 MG PO TABS: 25.0000 mg | ORAL_TABLET | Freq: Three times a day (TID) | ORAL | 0 refills | Status: DC | PRN

## 2018-05-05 MED ORDER — LORAZEPAM 0.5 MG PO TABS: 0.5000 mg | ORAL_TABLET | ORAL | 0 refills | Status: DC | PRN

## 2018-05-05 MED ORDER — LEVOTHYROXINE SODIUM 125 MCG PO TABS: 125.0000 ug | ORAL_TABLET | Freq: Every day | ORAL | 0 refills | Status: DC

## 2018-05-05 NOTE — Telephone Encounter (Signed)
Sent to pharmacy. Patient should follow-up for her anxiety to continue to receive this medication.

## 2018-05-05 NOTE — Telephone Encounter (Signed)
LMTCB to advise on below.

## 2018-05-05 NOTE — Telephone Encounter (Signed)
Last OV 11/11/2017   Last refilled 05/05/2018 disp 30 with no refills

## 2018-05-05 NOTE — Telephone Encounter (Signed)
Last OV 11/11/2017   Last refilled 11/16/2017 disp 30 with no refills   Next appt 11/17/2018 for CPE   Sent to [place] covering provider

## 2018-10-28 ENCOUNTER — Other Ambulatory Visit: Payer: Self-pay | Admitting: Family Medicine

## 2018-11-17 ENCOUNTER — Encounter: Payer: BLUE CROSS/BLUE SHIELD | Admitting: Family Medicine

## 2018-12-01 ENCOUNTER — Ambulatory Visit (INDEPENDENT_AMBULATORY_CARE_PROVIDER_SITE_OTHER): Payer: BC Managed Care – PPO | Admitting: Family Medicine

## 2018-12-01 ENCOUNTER — Other Ambulatory Visit (HOSPITAL_COMMUNITY)
Admission: RE | Admit: 2018-12-01 | Discharge: 2018-12-01 | Disposition: A | Discharge status: Home / Self Care | Payer: BC Managed Care – PPO | Source: Ambulatory Visit | Attending: Family Medicine | Admitting: Family Medicine

## 2018-12-01 ENCOUNTER — Other Ambulatory Visit: Payer: Self-pay

## 2018-12-01 ENCOUNTER — Encounter: Payer: Self-pay | Admitting: Family Medicine

## 2018-12-01 VITALS — BP 120/70 | HR 60 | Temp 97.0°F | Ht 66.0 in | Wt 134.0 lb

## 2018-12-01 DIAGNOSIS — R87612 Low grade squamous intraepithelial lesion on cytologic smear of cervix (LGSIL): Secondary | ICD-10-CM | POA: Insufficient documentation

## 2018-12-01 DIAGNOSIS — E785 Hyperlipidemia, unspecified: Secondary | ICD-10-CM

## 2018-12-01 DIAGNOSIS — E890 Postprocedural hypothyroidism: Secondary | ICD-10-CM

## 2018-12-01 DIAGNOSIS — Z1231 Encounter for screening mammogram for malignant neoplasm of breast: Secondary | ICD-10-CM

## 2018-12-01 DIAGNOSIS — Z23 Encounter for immunization: Secondary | ICD-10-CM

## 2018-12-01 DIAGNOSIS — Z0001 Encounter for general adult medical examination with abnormal findings: Secondary | ICD-10-CM

## 2018-12-01 MED ORDER — LEVOTHYROXINE SODIUM 125 MCG PO TABS: 125.0000 ug | ORAL_TABLET | Freq: Every day | ORAL | 3 refills | Status: DC

## 2018-12-01 NOTE — Assessment & Plan Note (Signed)
Physical exam completed.  She will continue diet and exercise.  Pap smear completed.  Discussed will await the results and then consider referral back to GYN.  Mammogram ordered and she knows to call to schedule this.  Shingrix given today.  Lab work as outlined below.

## 2018-12-01 NOTE — Progress Notes (Signed)
Jenna Rumps, MD Phone: [phone]  Jenna Davidson is a 63 y.o. female who presents today for cpe.  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  She gets 12000-15000 steps daily at work. Diets fairly healthy.  Lots of vegetables and fruits.  Some junk food though nothing excessive. Pap smear is due.  She had LSIL with positive HPV previously.  She underwent colposcopy with GYN revealing LSIL/CIN-1. Mammogram is due. Colonoscopy up-to-date 08/18/2009.  Patient reports no polyps.  She reports 10-year recall. No family history of colon cancer, breast cancer, or ovarian cancer. Tdap up-to-date.  Flu vaccine up-to-date.  She is due for second shingles vaccine. No tobacco use or illicit drug use.  Social alcohol use. Sees a dentist and an ophthalmology. No postmenopausal bleeding.   Active Ambulatory Problems    Diagnosis Date Noted  . Postoperative hypothyroidism 07/31/2015  . Hyperlipidemia 07/31/2015  . Anxiety 07/31/2015  . Encounter for health maintenance examination with abnormal findings 11/10/2016  . 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 Migraine 11/11/2017   Resolved Ambulatory Problems    Diagnosis Date Noted  . Encounter for preventative adult health care exam with abnormal findings 07/31/2015  . Bilateral hand pain 07/31/2015  . Post-nasal drip 11/05/2015  . Vaginal discharge 07/29/2016   Past Medical History:  Diagnosis Date  . Allergy   . Cancer ([place]) 1983  . Chicken pox   . Heart murmur   . Hypothyroidism   . Kidney stones   . Migraines     Family History  Problem Relation Age of Onset  . Alcohol abuse Father   . Lung cancer Father   . Heart disease Father   . Diabetes Maternal Grandmother   . Diabetes Maternal Grandfather   . Uterine cancer Paternal Grandmother   . Heart disease Paternal Grandmother   . Heart disease Paternal Grandfather   . Stroke Paternal Grandfather     Social History   Socioeconomic History  . Marital status: Widowed   Spouse name: Not on file  . Number of children: Not on file  . Years of education: Not on file  . Highest education level: Not on file  Occupational History  . Not on file  Social Needs  . Financial resource strain: Not on file  . Food insecurity    Worry: Not on file    Inability: Not on file  . Transportation needs    Medical: Not on file    Non-medical: Not on file  Tobacco Use  . Smoking status: Never Smoker  . Smokeless tobacco: Never Used  Substance and Sexual Activity  . Alcohol use: Yes    Alcohol/week: 0.0 - 1.0 standard drinks  . Drug use: No  . Sexual activity: Yes    Birth control/protection: Post-menopausal  Lifestyle  . Physical activity    Days per week: Not on file    Minutes per session: Not on file  . Stress: Not on file  Relationships  . Social Herbalist on phone: Not on file    Gets together: Not on file    Attends religious service: Not on file    Active member of club or organization: Not on file    Attends meetings of clubs or organizations: Not on file    Relationship status: Not on file  . Intimate partner violence    Fear of current or ex partner: Not on file    Emotionally abused: Not on file  Physically abused: Not on file    Forced sexual activity: Not on file  Other Topics Concern  . Not on file  Social History Narrative  . Not on file    ROS  General:  Negative for nexplained weight loss, fever Skin: Negative for new or changing mole, sore that won't heal HEENT: Negative for trouble hearing, trouble seeing, ringing in ears, mouth sores, hoarseness, change in voice, dysphagia. CV:  Negative for chest pain, dyspnea, edema, palpitations Resp: Negative for cough, dyspnea, hemoptysis GI: Negative for nausea, vomiting, diarrhea, constipation, abdominal pain, melena, hematochezia. GU: Negative for dysuria, incontinence, urinary hesitance, hematuria, vaginal or penile discharge, polyuria, sexual difficulty, lumps in testicle  or breasts MSK: Negative for muscle cramps or aches, joint pain or swelling Neuro: Negative for headaches, weakness, numbness, dizziness, passing out/fainting Psych: Negative for depression, anxiety, memory problems  Objective  Physical Exam Vitals:   12/01/18 1439  BP: 120/70  Pulse: 60  Temp: (!) 97 F (36.1 C)  SpO2: 96%    BP Readings from Last 3 Encounters:  12/01/18 120/70  12/08/17 122/80  11/11/17 120/78   Wt Readings from Last 3 Encounters:  12/01/18 134 lb (60.8 kg)  12/08/17 142 lb (64.4 kg)  11/11/17 141 lb 3.2 oz (64 kg)    Physical Exam Constitutional:      General: She is not in acute distress.    Appearance: She is not diaphoretic.  Eyes:     Conjunctiva/sclera: Conjunctivae normal.     Pupils: Pupils are equal, round, and reactive to light.  Cardiovascular:     Rate and Rhythm: Normal rate and regular rhythm.     Heart sounds: Normal heart sounds.  Pulmonary:     Effort: Pulmonary effort is normal.     Breath sounds: Normal breath sounds.  Abdominal:     General: Bowel sounds are normal. There is no distension.     Palpations: Abdomen is soft.     Tenderness: There is no abdominal tenderness. There is no guarding or rebound.  Genitourinary:    Comments: Chaperone used, normal labia, normal vaginal mucosa, cervix with prominent vasculature noted, normal bimanual exam with no adnexal masses or tenderness, bilateral breast with no skin changes, nipple inversion, masses, or tenderness, patient is status post breast implants, no axillary masses bilaterally Musculoskeletal:     Right lower leg: No edema.     Left lower leg: No edema.  Lymphadenopathy:     Cervical: No cervical adenopathy.  Skin:    General: Skin is warm and dry.  Neurological:     Mental Status: She is alert.  Psychiatric:        Mood and Affect: Mood normal.      Assessment/Plan:   Encounter for health maintenance examination with abnormal findings Physical exam completed.   She will continue diet and exercise.  Pap smear completed.  Discussed will await the results and then consider referral back to GYN.  Mammogram ordered and she knows to call to schedule this.  Shingrix given today.  Lab work as outlined below.   Orders Placed This Encounter  Procedures  . MM 3D SCREEN BREAST BILATERAL    Standing Status:   Future    Standing Expiration Date:   01/31/2020    Order Specific Question:   Reason for Exam (SYMPTOM  OR DIAGNOSIS REQUIRED)    Answer:   breast cancer screening    Order Specific Question:   Preferred imaging location?    Answer:  [facility]  . Varicella-zoster vaccine IM (Shingrix)  . Lipid panel  . Comp Met (CMET)  . TSH    Meds ordered this encounter  Medications  . levothyroxine (SYNTHROID) 125 MCG tablet    Sig: Take 1 tablet (125 mcg total) by mouth daily before breakfast.    Dispense:  90 tablet    Refill:  3     Jenna Rumps, MD [place]

## 2018-12-01 NOTE — Patient Instructions (Signed)
Nice to see you. Please continue with diet and exercise. We will contact you with your lab results and Pap smear results. Please call to schedule your mammogram.

## 2018-12-02 LAB — COMPREHENSIVE METABOLIC PANEL
AG Ratio: 1.7 (calc) (ref 1.0–2.5)
ALT: 9 U/L (ref 6–29)
AST: 14 U/L (ref 10–35)
Albumin: 4.3 g/dL (ref 3.6–5.1)
Alkaline phosphatase (APISO): 45 U/L (ref 37–153)
BUN: 13 mg/dL (ref 7–25)
CO2: 28 mmol/L (ref 20–32)
Calcium: 9.6 mg/dL (ref 8.6–10.4)
Chloride: 106 mmol/L (ref 98–110)
Creat: 0.77 mg/dL (ref 0.50–0.99)
Globulin: 2.6 g/dL (calc) (ref 1.9–3.7)
Glucose, Bld: 79 mg/dL (ref 65–99)
Potassium: 3.8 mmol/L (ref 3.5–5.3)
Sodium: 141 mmol/L (ref 135–146)
Total Bilirubin: 0.4 mg/dL (ref 0.2–1.2)
Total Protein: 6.9 g/dL (ref 6.1–8.1)

## 2018-12-02 LAB — LIPID PANEL
Cholesterol: 206 mg/dL — ABNORMAL HIGH (ref <200)
HDL: 59 mg/dL (ref >=50)
LDL Cholesterol (Calc): 127 mg/dL (calc) — ABNORMAL HIGH
Non-HDL Cholesterol (Calc): 147 mg/dL (calc) — ABNORMAL HIGH (ref <130)
Total CHOL/HDL Ratio: 3.5 (calc) (ref <5.0)
Triglycerides: 95 mg/dL (ref <150)

## 2018-12-02 LAB — TSH: TSH: 0.59 mIU/L (ref 0.40–4.50)

## 2018-12-07 ENCOUNTER — Encounter: Payer: Self-pay | Admitting: Family Medicine

## 2018-12-07 ENCOUNTER — Other Ambulatory Visit: Payer: Self-pay | Admitting: Family Medicine

## 2018-12-07 MED ORDER — LORAZEPAM 0.5 MG PO TABS: 0.5000 mg | ORAL_TABLET | ORAL | 0 refills | Status: DC | PRN

## 2018-12-07 MED ORDER — CYCLOBENZAPRINE HCL 5 MG PO TABS: 5.0000 mg | ORAL_TABLET | Freq: Three times a day (TID) | ORAL | 3 refills | Status: DC | PRN

## 2018-12-07 MED ORDER — RIZATRIPTAN BENZOATE 5 MG PO TABS: ORAL_TABLET | ORAL | 2 refills | Status: DC

## 2018-12-12 LAB — CYTOLOGY - PAP
Comment: NEGATIVE
Diagnosis: NEGATIVE
High risk HPV: NEGATIVE

## 2018-12-29 ENCOUNTER — Other Ambulatory Visit: Payer: Self-pay

## 2018-12-29 ENCOUNTER — Encounter: Payer: Self-pay | Admitting: Family Medicine

## 2018-12-29 DIAGNOSIS — Z20822 Contact with and (suspected) exposure to covid-19: Secondary | ICD-10-CM

## 2018-12-31 LAB — NOVEL CORONAVIRUS, NAA: SARS-CoV-2, NAA: NOT DETECTED

## 2019-01-01 ENCOUNTER — Telehealth: Payer: Self-pay | Admitting: Family Medicine

## 2019-01-01 NOTE — Telephone Encounter (Signed)
Negative COVID results given. Patient results "NOT Detected." Caller expressed understanding. ° °

## 2019-04-29 ENCOUNTER — Ambulatory Visit: Payer: BC Managed Care – PPO

## 2019-04-29 ENCOUNTER — Ambulatory Visit: Payer: BC Managed Care – PPO | Attending: Internal Medicine

## 2019-04-29 DIAGNOSIS — Z23 Encounter for immunization: Secondary | ICD-10-CM | POA: Insufficient documentation

## 2019-04-29 NOTE — Progress Notes (Signed)
   Covid-19 Vaccination Clinic  Name:  Jenna Davidson    MRN: [id number] DOB: [birth year]  04/29/2019  [name] was observed post Covid-19 immunization for 15 minutes without incident. She was provided with Vaccine Information Sheet and instruction to access the V-Safe system.   [name] was instructed to call 911 with any severe reactions post vaccine: Marland Kitchen Difficulty breathing  . Swelling of face and throat  . A fast heartbeat  . A bad rash all over body  . Dizziness and weakness   Immunizations Administered    Name Date Dose VIS Date Route   Pfizer COVID-19 Vaccine 04/29/2019 11:13 AM 0.3 mL 02/02/2019 Intramuscular   Manufacturer: [place]   Lot: [id number]   [place]: [id number]

## 2019-05-21 ENCOUNTER — Ambulatory Visit: Payer: BC Managed Care – PPO | Attending: Internal Medicine

## 2019-05-21 DIAGNOSIS — Z23 Encounter for immunization: Secondary | ICD-10-CM

## 2019-05-21 NOTE — Progress Notes (Signed)
   Covid-19 Vaccination Clinic  Name:  Jenna Davidson    MRN: [id number] DOB: 1955/06/10  05/21/2019  Jenna Davidson was observed post Covid-19 immunization for 15 minutes without incident. She was provided with Vaccine Information Sheet and instruction to access the V-Safe system.   Jenna Davidson was instructed to call 911 with any severe reactions post vaccine: Marland Kitchen Difficulty breathing  . Swelling of face and throat  . A fast heartbeat  . A bad rash all over body  . Dizziness and weakness   Immunizations Administered    Name Date Dose VIS Date Route   Pfizer COVID-19 Vaccine 05/21/2019  8:51 AM 0.3 mL 02/02/2019 Intramuscular   Manufacturer: [place]   Lot: [id number]   [place]: [id number]

## 2019-10-07 ENCOUNTER — Other Ambulatory Visit: Payer: Self-pay | Admitting: Family Medicine

## 2019-10-08 NOTE — Telephone Encounter (Signed)
Left message for patient to return call back. Need questions answered for medication refill. Patient has appointment 12-03-19

## 2019-10-08 NOTE — Telephone Encounter (Signed)
Please find out what she is taking the Ativan for. She will need an appointment to discuss this medication and for a refill as she has not been seen since October 2020.

## 2019-10-09 NOTE — Telephone Encounter (Signed)
Left message for patient to return call back.  

## 2019-10-10 NOTE — Telephone Encounter (Signed)
Left message for patient to return call back.  

## 2019-10-15 ENCOUNTER — Encounter: Payer: Self-pay | Admitting: Family Medicine

## 2019-10-28 ENCOUNTER — Other Ambulatory Visit: Payer: Self-pay | Admitting: Family Medicine

## 2019-12-03 ENCOUNTER — Ambulatory Visit (INDEPENDENT_AMBULATORY_CARE_PROVIDER_SITE_OTHER): Payer: BC Managed Care – PPO | Admitting: Family Medicine

## 2019-12-03 ENCOUNTER — Encounter: Payer: Self-pay | Admitting: Family Medicine

## 2019-12-03 ENCOUNTER — Other Ambulatory Visit: Payer: Self-pay

## 2019-12-03 ENCOUNTER — Other Ambulatory Visit (HOSPITAL_COMMUNITY)
Admission: RE | Admit: 2019-12-03 | Discharge: 2019-12-03 | Disposition: A | Discharge status: Home / Self Care | Payer: BC Managed Care – PPO | Source: Ambulatory Visit | Attending: Family Medicine | Admitting: Family Medicine

## 2019-12-03 VITALS — BP 118/80 | HR 64 | Temp 98.8°F | Ht 66.0 in | Wt 134.6 lb

## 2019-12-03 DIAGNOSIS — Z124 Encounter for screening for malignant neoplasm of cervix: Secondary | ICD-10-CM

## 2019-12-03 DIAGNOSIS — E785 Hyperlipidemia, unspecified: Secondary | ICD-10-CM

## 2019-12-03 DIAGNOSIS — Z1231 Encounter for screening mammogram for malignant neoplasm of breast: Secondary | ICD-10-CM

## 2019-12-03 DIAGNOSIS — J302 Other seasonal allergic rhinitis: Secondary | ICD-10-CM

## 2019-12-03 DIAGNOSIS — F419 Anxiety disorder, unspecified: Secondary | ICD-10-CM

## 2019-12-03 DIAGNOSIS — R87612 Low grade squamous intraepithelial lesion on cytologic smear of cervix (LGSIL): Secondary | ICD-10-CM

## 2019-12-03 DIAGNOSIS — N889 Noninflammatory disorder of cervix uteri, unspecified: Secondary | ICD-10-CM

## 2019-12-03 DIAGNOSIS — Z23 Encounter for immunization: Secondary | ICD-10-CM

## 2019-12-03 DIAGNOSIS — Z0001 Encounter for general adult medical examination with abnormal findings: Secondary | ICD-10-CM

## 2019-12-03 DIAGNOSIS — E890 Postprocedural hypothyroidism: Secondary | ICD-10-CM

## 2019-12-03 DIAGNOSIS — Z1211 Encounter for screening for malignant neoplasm of colon: Secondary | ICD-10-CM

## 2019-12-03 NOTE — Patient Instructions (Signed)
Nice to see you. We will refer you back to your GYN. We will get lab work today and contact you with the results. Please call to schedule your mammogram. GI will contact you to set up an appointment as well for colonoscopy.

## 2019-12-03 NOTE — Progress Notes (Signed)
Tommi Rumps, MD Phone: [phone]  Jenna Davidson is a 64 y.o. female who presents today for CPE.  Diet: Tries to eat a reasonable healthy diet.  Not much processed food Exercise: Does a burn [facility] and also walks 12-15,000 steps a day. Pap smear: Due, normal last year though the prior year had L SIL with positive HPV Colonoscopy: Due Mammogram: Due Family history-  Colon cancer: No  Breast cancer: No  Ovarian cancer: No Menses: Postmenopausal Vaccines-   Flu: Up-to-date  Tetanus: Due  Shingles: Up-to-date  COVID19: Up-to-date HIV screening: Previously declined Hep C Screening: Up-to-date Tobacco use: No Alcohol use: Only socially Illicit Drug use: No Dentist: Yes Ophthalmology: Yes  Anxiety/depression: Patient notes this is stable.  She notes occasionally feeling sad as she lost her husband 7 years ago and notes the pandemic as are down though notes no significant depression.  Anxiety is typically related to flying and she rarely has to use the Ativan.  She does not feel like she needs any additional medication for this.  Allergic rhinitis: Chronic issues with this.  Typically has postnasal drip throughout the fall.  Wonders what she can take for this.   Active Ambulatory Problems    Diagnosis Date Noted  . Postoperative hypothyroidism 07/31/2015  . Hyperlipidemia 07/31/2015  . Anxiety 07/31/2015  . Encounter for health maintenance examination with abnormal findings 11/10/2016  . 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 Migraine 11/11/2017  . Allergic rhinitis 12/06/2019   Resolved Ambulatory Problems    Diagnosis Date Noted  . Encounter for preventative adult health care exam with abnormal findings 07/31/2015  . Bilateral hand pain 07/31/2015  . Post-nasal drip 11/05/2015  . Vaginal discharge 07/29/2016   Past Medical History:  Diagnosis Date  . Allergy   . Cancer ([place]) 1983  . Chicken pox   . Heart murmur   . Hypothyroidism   . Kidney stones     . Migraines     Family History  Problem Relation Age of Onset  . Alcohol abuse Father   . Lung cancer Father   . Heart disease Father   . Diabetes Maternal Grandmother   . Diabetes Maternal Grandfather   . Uterine cancer Paternal Grandmother   . Heart disease Paternal Grandmother   . Heart disease Paternal Grandfather   . Stroke Paternal Grandfather     Social History   Socioeconomic History  . Marital status: Widowed    Spouse name: Not on file  . Number of children: Not on file  . Years of education: Not on file  . Highest education level: Not on file  Occupational History  . Not on file  Tobacco Use  . Smoking status: Never Smoker  . Smokeless tobacco: Never Used  Vaping Use  . Vaping Use: Never used  Substance and Sexual Activity  . Alcohol use: Yes    Alcohol/week: 0.0 - 1.0 standard drinks  . Drug use: No  . Sexual activity: Yes    Birth control/protection: Post-menopausal  Other Topics Concern  . Not on file  Social History Narrative  . Not on file   Social Determinants of Health   Financial Resource Strain:   . Difficulty of Paying Living Expenses: Not on file  Food Insecurity:   . Worried About Charity fundraiser in the Last Year: Not on file  . Ran Out of Food in the Last Year: Not on file  Transportation Needs:   . Lack of Transportation (Medical):  Not on file  . Lack of Transportation (Non-Medical): Not on file  Physical Activity:   . Days of Exercise per Week: Not on file  . Minutes of Exercise per Session: Not on file  Stress:   . Feeling of Stress : Not on file  Social Connections:   . Frequency of Communication with Friends and Family: Not on file  . Frequency of Social Gatherings with Friends and Family: Not on file  . Attends Religious Services: Not on file  . Active Member of Clubs or Organizations: Not on file  . Attends Archivist Meetings: Not on file  . Marital Status: Not on file  Intimate Partner Violence:   . Fear  of Current or Ex-Partner: Not on file  . Emotionally Abused: Not on file  . Physically Abused: Not on file  . Sexually Abused: Not on file    ROS  General:  Negative for nexplained weight loss, fever Skin: Negative for new or changing mole, sore that won't heal HEENT: Negative for trouble hearing, trouble seeing, ringing in ears, mouth sores, hoarseness, change in voice, dysphagia. CV:  Negative for chest pain, dyspnea, edema, palpitations Resp: Negative for cough, dyspnea, hemoptysis GI: Negative for nausea, vomiting, diarrhea, constipation, abdominal pain, melena, hematochezia. GU: Negative for dysuria, incontinence, urinary hesitance, hematuria, vaginal or penile discharge, polyuria, sexual difficulty, lumps in testicle or breasts MSK: Negative for muscle cramps or aches, joint pain or swelling Neuro: Positive for headaches (chronic migraines), negative for weakness, numbness, dizziness, passing out/fainting Psych: Positive for depression, anxiety, negative for memory problems  Objective  Physical Exam Vitals:   12/03/19 1453  BP: 118/80  Pulse: 64  Temp: 98.8 F (37.1 C)  SpO2: 97%    BP Readings from Last 3 Encounters:  12/03/19 118/80  12/01/18 120/70  12/08/17 122/80   Wt Readings from Last 3 Encounters:  12/03/19 134 lb 9.6 oz (61.1 kg)  12/01/18 134 lb (60.8 kg)  12/08/17 142 lb (64.4 kg)    Physical Exam Constitutional:      General: She is not in acute distress.    Appearance: She is not diaphoretic.  HENT:     Head: Normocephalic and atraumatic.  Eyes:     Conjunctiva/sclera: Conjunctivae normal.     Pupils: Pupils are equal, round, and reactive to light.  Cardiovascular:     Rate and Rhythm: Normal rate and regular rhythm.     Heart sounds: Normal heart sounds.  Pulmonary:     Effort: Pulmonary effort is normal.     Breath sounds: Normal breath sounds.  Abdominal:     General: Bowel sounds are normal. There is no distension.     Palpations:  Abdomen is soft.     Tenderness: There is no abdominal tenderness. There is no guarding or rebound.  Genitourinary:    Comments: Fulton Mole, CMA served as chaperone, bilateral breasts with no masses, tenderness, nipple inversion, or skin changes, patient has breast implants, no axillary masses bilaterally, normal labia, normal vaginal mucosa, cervix with prominent blood vessels, uterus is midline and freely mobile, no axillary masses or tenderness Lymphadenopathy:     Cervical: No cervical adenopathy.  Skin:    General: Skin is warm and dry.  Neurological:     Mental Status: She is alert.      Assessment/Plan:   Problem List Items Addressed This Visit    Allergic rhinitis    The patient can trial second-generation antihistamine or Flonase over-the-counter.  Anxiety    Stable.  Okay to continue with rare Ativan use as needed.      Relevant Medications   LORazepam (ATIVAN) 0.5 MG tablet   Encounter for health maintenance examination with abnormal findings - Primary    Physical exam completed.  Encouraged continued healthy diet and exercise.  She will call to schedule her mammogram.  Refer to GI for colonoscopy.  Refer to GYN given prominent vasculature on her cervix though I suspect this is a normal variant.  Pap smear completed given history of LSIL with HPV.  Tetanus vaccine given today.  Lab work as outlined below.      Hyperlipidemia   Relevant Orders   Comp Met (CMET) (Completed)   Lipid panel (Completed)   LGSIL on Pap smear of cervix   Postoperative hypothyroidism   Relevant Medications   levothyroxine (SYNTHROID) 125 MCG tablet   Other Relevant Orders   TSH (Completed)    Other Visit Diagnoses    Cervical cancer screening       Relevant Orders   Cytology - PAP( [place]) (Completed)   Colon cancer screening       Relevant Orders   Ambulatory referral to Gastroenterology   Encounter for screening mammogram for malignant neoplasm of breast        Relevant Orders   MM 3D SCREEN BREAST BILATERAL   Abnormal appearance of cervix       Relevant Orders   Ambulatory referral to Gynecology   Need for Td vaccine       Relevant Orders   Td : Tetanus/diphtheria >7yo Preservative  free (Completed)      This visit occurred during the SARS-CoV-2 public health emergency.  Safety protocols were in place, including screening questions prior to the visit, additional usage of staff PPE, and extensive cleaning of exam room while observing appropriate contact time as indicated for disinfecting solutions.    Tommi Rumps, MD [place]

## 2019-12-04 LAB — COMPREHENSIVE METABOLIC PANEL
ALT: 11 U/L (ref 0–35)
AST: 13 U/L (ref 0–37)
Albumin: 4.2 g/dL (ref 3.5–5.2)
Alkaline Phosphatase: 56 U/L (ref 39–117)
BUN: 15 mg/dL (ref 6–23)
CO2: 26 mEq/L (ref 19–32)
Calcium: 9.3 mg/dL (ref 8.4–10.5)
Chloride: 107 mEq/L (ref 96–112)
Creatinine, Ser: 0.76 mg/dL (ref 0.40–1.20)
GFR: 83.86 mL/min (ref >60.00)
Glucose, Bld: 92 mg/dL (ref 70–99)
Potassium: 3.8 mEq/L (ref 3.5–5.1)
Sodium: 139 mEq/L (ref 135–145)
Total Bilirubin: 0.4 mg/dL (ref 0.2–1.2)
Total Protein: 7.1 g/dL (ref 6.0–8.3)

## 2019-12-04 LAB — LIPID PANEL
Cholesterol: 222 mg/dL — ABNORMAL HIGH (ref 0–200)
HDL: 58.5 mg/dL (ref >39.00)
LDL Cholesterol: 141 mg/dL — ABNORMAL HIGH (ref 0–99)
NonHDL: 163.41
Total CHOL/HDL Ratio: 4
Triglycerides: 110 mg/dL (ref 0.0–149.0)
VLDL: 22 mg/dL (ref 0.0–40.0)

## 2019-12-04 LAB — TSH: TSH: 0.14 u[IU]/mL — ABNORMAL LOW (ref 0.35–4.50)

## 2019-12-05 ENCOUNTER — Telehealth: Payer: Self-pay | Admitting: Obstetrics & Gynecology

## 2019-12-05 LAB — CYTOLOGY - PAP
Comment: NEGATIVE
Diagnosis: NEGATIVE
High risk HPV: NEGATIVE

## 2019-12-05 NOTE — Telephone Encounter (Signed)
[facility] referring for Abnormal appearance of cervix. Called and left voicemail for patient to call back to be scheduled.

## 2019-12-06 DIAGNOSIS — J309 Allergic rhinitis, unspecified: Secondary | ICD-10-CM | POA: Insufficient documentation

## 2019-12-06 MED ORDER — LORAZEPAM 0.5 MG PO TABS: 0.5000 mg | ORAL_TABLET | ORAL | 0 refills | Status: DC | PRN

## 2019-12-06 MED ORDER — LEVOTHYROXINE SODIUM 125 MCG PO TABS
125.0000 ug | ORAL_TABLET | Freq: Every day | ORAL | 3 refills | Status: DC
Start: 2019-12-06 — End: 2020-12-03

## 2019-12-06 MED ORDER — CYCLOBENZAPRINE HCL 5 MG PO TABS: 5.0000 mg | ORAL_TABLET | Freq: Three times a day (TID) | ORAL | 3 refills | Status: DC | PRN

## 2019-12-06 NOTE — Assessment & Plan Note (Signed)
Stable.  Okay to continue with rare Ativan use as needed.

## 2019-12-06 NOTE — Assessment & Plan Note (Signed)
The patient can trial second-generation antihistamine or Flonase over-the-counter.

## 2019-12-06 NOTE — Assessment & Plan Note (Signed)
Physical exam completed.  Encouraged continued healthy diet and exercise.  She will call to schedule her mammogram.  Refer to GI for colonoscopy.  Refer to GYN given prominent vasculature on her cervix though I suspect this is a normal variant.  Pap smear completed given history of LSIL with HPV.  Tetanus vaccine given today.  Lab work as outlined below.

## 2019-12-06 NOTE — Telephone Encounter (Signed)
Called and left voicemail for patient to call back to be scheduled. 

## 2019-12-07 NOTE — Telephone Encounter (Signed)
Called and left voicemail for patient to call back to be scheduled. 

## 2019-12-09 DIAGNOSIS — N12 Tubulo-interstitial nephritis, not specified as acute or chronic: Secondary | ICD-10-CM | POA: Diagnosis not present

## 2019-12-12 ENCOUNTER — Encounter: Payer: Self-pay | Admitting: *Deleted

## 2019-12-13 ENCOUNTER — Telehealth: Payer: Self-pay | Admitting: Family Medicine

## 2019-12-13 NOTE — Telephone Encounter (Signed)
Rejection Reason - Patient did not respond" [facility] said on Dec 13, 2019 10:24 AM  Mailed "unable to contact" letter to home and referring provider.

## 2019-12-14 NOTE — Telephone Encounter (Signed)
Please follow-up with the patient and let her know that GI is trying to get in touch with her to schedule her. She should contact them to get an appointment set up.

## 2020-01-10 ENCOUNTER — Other Ambulatory Visit (HOSPITAL_COMMUNITY)
Admission: RE | Admit: 2020-01-10 | Discharge: 2020-01-10 | Disposition: A | Discharge status: Home / Self Care | Payer: BC Managed Care – PPO | Source: Ambulatory Visit | Attending: Obstetrics & Gynecology | Admitting: Obstetrics & Gynecology

## 2020-01-10 ENCOUNTER — Other Ambulatory Visit: Payer: Self-pay

## 2020-01-10 ENCOUNTER — Ambulatory Visit (INDEPENDENT_AMBULATORY_CARE_PROVIDER_SITE_OTHER): Payer: BC Managed Care – PPO | Admitting: Obstetrics & Gynecology

## 2020-01-10 ENCOUNTER — Encounter: Payer: Self-pay | Admitting: Obstetrics & Gynecology

## 2020-01-10 VITALS — BP 118/80 | Ht 66.5 in | Wt 134.0 lb

## 2020-01-10 DIAGNOSIS — R61 Generalized hyperhidrosis: Secondary | ICD-10-CM | POA: Diagnosis not present

## 2020-01-10 DIAGNOSIS — N889 Noninflammatory disorder of cervix uteri, unspecified: Secondary | ICD-10-CM

## 2020-01-10 DIAGNOSIS — G542 Cervical root disorders, not elsewhere classified: Secondary | ICD-10-CM | POA: Diagnosis not present

## 2020-01-10 MED ORDER — CLONIDINE 0.1 MG/24HR TD PTWK: 0.1000 mg | MEDICATED_PATCH | TRANSDERMAL | 11 refills | Status: DC

## 2020-01-10 NOTE — Progress Notes (Signed)
HPI: 1.  [name] is a 64 y.o. G1P1001 who is postmenopausal, presents today for a problem visit.  She complains of mostly night sweats at this point as a significant bother to her on an almost daily (nightly) frequency; awakening several times per night.   Symptoms have been present for several years. Symptoms are mod to severe and has led her to come in today to seek options for intervention.  Previous Treatment: compounded hormonal therapy 7+ years ago that did help then (in [place])  She is occasionally sexually active w same partner of 5 years (widowed prior). Denies PostMenopausal Bleeding  2.  She also presents form recommendation from PCP as had abnormal appearing cervix on exam for PAP last month.  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  Normal PAP in 2020 and 2021 (last month).  PMHx: She  has a past medical history of Allergy, Cancer ([place]) (1983), Chicken pox, Heart murmur, Hypothyroidism, Kidney stones, and Migraines. Also,  has a past surgical history that includes Breast enhancement surgery (2006); Tonsillectomy and adenoidectomy (1967); and Augmentation mammaplasty (Bilateral, 2004)., family history includes Alcohol abuse in her father; Diabetes in her maternal grandfather and maternal grandmother; Heart disease in her father, paternal grandfather, and paternal grandmother; Lung cancer in her father; Stroke in her paternal grandfather; Uterine cancer in her paternal grandmother.,  reports that she has never smoked. She has never used smokeless tobacco. She reports current alcohol use. She reports that she does not use drugs.  She has a current medication list which includes the following prescription(s): acetaminophen, chlorpheniramine, cholecalciferol, cyanocobalamin, cyclobenzaprine, ibuprofen-diphenhydramine cit, levothyroxine, lorazepam, magnesium, naproxen sodium, omega-3 fatty acids, promethazine, rizatriptan, and clonidine. Also, has No Known Allergies.  Review of  Systems  Constitutional: Negative for chills, fever and malaise/fatigue.  HENT: Negative for congestion, sinus pain and sore throat.   Eyes: Negative for blurred vision and pain.  Respiratory: Negative for cough and wheezing.   Cardiovascular: Negative for chest pain and leg swelling.  Gastrointestinal: Negative for abdominal pain, constipation, diarrhea, heartburn, nausea and vomiting.  Genitourinary: Negative for dysuria, frequency, hematuria and urgency.  Musculoskeletal: Negative for back pain, joint pain, myalgias and neck pain.  Skin: Negative for itching and rash.  Neurological: Negative for dizziness, tremors and weakness.  Endo/Heme/Allergies: Does not bruise/bleed easily.  Psychiatric/Behavioral: Negative for depression. The patient is not nervous/anxious and does not have insomnia.     Objective: BP 118/80   Ht 5' 6.5" (1.689 m)   Wt 134 lb (60.8 kg)   BMI 21.30 kg/m  Physical Exam Constitutional:      General: She is not in acute distress.    Appearance: She is well-developed.  Genitourinary:     Pelvic exam was performed with patient supine.     Vagina and uterus normal.     No vaginal erythema or bleeding.     No cervical motion tenderness, discharge, polyp or nabothian cyst.     Uterus is mobile.     Uterus is not enlarged.     No uterine mass detected.    Uterus is midaxial.     No right or left adnexal mass present.     Right adnexa not tender.     Left adnexa not tender.     Genitourinary Comments: Scar like effect on 3 o'clock area of cervix  HENT:     Head: Normocephalic and atraumatic.     Nose: Nose normal.  Abdominal:  General: There is no distension.     Palpations: Abdomen is soft.     Tenderness: There is no abdominal tenderness.  Musculoskeletal:        General: Normal range of motion.  Neurological:     Mental Status: She is alert and oriented to person, place, and time.     Cranial Nerves: No cranial nerve deficit.  Skin:    General:  Skin is warm and dry.  Psychiatric:        Attention and Perception: Attention normal.        Mood and Affect: Mood and affect normal.        Speech: Speech normal.        Behavior: Behavior normal.        Thought Content: Thought content normal.        Judgment: Judgment normal.     ASSESSMENT/PLAN:  Menopause.   ICD-10-CM   1. Cervical lesion  N88.9 Surgical pathology  2. Night sweats  R61    Cervical biopsy performed Follow up based on results Likely to cont w yearly PAP  Patient with bothersome menopausal vasomotor symptoms. Discussed lifestyle interventions such as wearing light clothing, remaining in cool environments, having fan/air conditioner in the room, avoiding hot beverages etc.  Exercise also shown to be significantly helpful in alleviating hot flashes.  Discussed using hormone therapy and concerns about increased risk of heart disease, cerebrovascular disease, thromboembolic disease,  and breast cancer.  Also discussed other medical options such as Clonidine, SSRI, or Neurontin.   Also discussed alternative therapies such as herbal remedies but cautioned that most of the products contained phytoestrogens (plant estrogens) in unregulated amounts which can have the same effects on the body as the pharmaceutical estrogen preparations.  Patient opted for CLONIDINE therapy for now. She will return in 2 months for re-evaluation.   Barnett Applebaum, MD, [facility], [facility] 01/10/2020  10:18 AM

## 2020-01-10 NOTE — Patient Instructions (Signed)
Menopause Menopause is the normal time of life when menstrual periods stop completely. It is usually confirmed by 12 months without a menstrual period. The transition to menopause (perimenopause) most often happens between the ages of 45 and 55. During perimenopause, hormone levels change in your body, which can cause symptoms and affect your health. Menopause may increase your risk for:  Loss of bone (osteoporosis), which causes bone breaks (fractures).  Depression.  Hardening and narrowing of the arteries (atherosclerosis), which can cause heart attacks and strokes. What are the causes? This condition is usually caused by a natural change in hormone levels that happens as you get older. The condition may also be caused by surgery to remove both ovaries (bilateral oophorectomy). What increases the risk? This condition is more likely to start at an earlier age if you have certain medical conditions or treatments, including:  A tumor of the pituitary gland in the brain.  A disease that affects the ovaries and hormone production.  Radiation treatment for cancer.  Certain cancer treatments, such as chemotherapy or hormone (anti-estrogen) therapy.  Heavy smoking and excessive alcohol use.  Family history of early menopause. This condition is also more likely to develop earlier in women who are very thin. What are the signs or symptoms? Symptoms of this condition include:  Hot flashes.  Irregular menstrual periods.  Night sweats.  Changes in feelings about sex. This could be a decrease in sex drive or an increased comfort around your sexuality.  Vaginal dryness and thinning of the vaginal walls. This may cause painful intercourse.  Dryness of the skin and development of wrinkles.  Headaches.  Problems sleeping (insomnia).  Mood swings or irritability.  Memory problems.  Weight gain.  Hair growth on the face and chest.  Bladder infections or problems with urinating. How  is this diagnosed? This condition is diagnosed based on your medical history, a physical exam, your age, your menstrual history, and your symptoms. Hormone tests may also be done. How is this treated? In some cases, no treatment is needed. You and your health care provider should make a decision together about whether treatment is necessary. Treatment will be based on your individual condition and preferences. Treatment for this condition focuses on managing symptoms. Treatment may include:  Menopausal hormone therapy (MHT).  Medicines to treat specific symptoms or complications.  Acupuncture.  Vitamin or herbal supplements. Before starting treatment, make sure to let your health care provider know if you have a personal or family history of:  Heart disease.  Breast cancer.  Blood clots.  Diabetes.  Osteoporosis. Follow these instructions at home: Lifestyle  Do not use any products that contain nicotine or tobacco, such as cigarettes and e-cigarettes. If you need help quitting, ask your health care provider.  Get at least 30 minutes of physical activity on 5 or more days each week.  Avoid alcoholic and caffeinated beverages, as well as spicy foods. This may help prevent hot flashes.  Get 7-8 hours of sleep each night.  If you have hot flashes, try: ? Dressing in layers. ? Avoiding things that may trigger hot flashes, such as spicy food, warm places, or stress. ? Taking slow, deep breaths when a hot flash starts. ? Keeping a fan in your home and office.  Find ways to manage stress, such as deep breathing, meditation, or journaling.  Consider going to group therapy with other women who are having menopause symptoms. Ask your health care provider about recommended group therapy meetings. Eating and   drinking  Eat a healthy, balanced diet that contains whole grains, lean protein, low-fat dairy, and plenty of fruits and vegetables.  Your health care provider may recommend  adding more soy to your diet. Foods that contain soy include tofu, tempeh, and soy milk.  Eat plenty of foods that contain calcium and vitamin D for bone health. Items that are rich in calcium include low-fat milk, yogurt, beans, almonds, sardines, broccoli, and kale. Medicines  Take over-the-counter and prescription medicines only as told by your health care provider.  Talk with your health care provider before starting any herbal supplements. If prescribed, take vitamins and supplements as told by your health care provider. These may include: ? Calcium. Women age 51 and older should get 1,200 mg (milligrams) of calcium every day. ? Vitamin D. Women need 600-800 International Units of vitamin D each day. ? Vitamins B12 and B6. Aim for 50 micrograms of B12 and 1.5 mg of B6 each day. General instructions  Keep track of your menstrual periods, including: ? When they occur. ? How heavy they are and how long they last. ? How much time passes between periods.  Keep track of your symptoms, noting when they start, how often you have them, and how long they last.  Use vaginal lubricants or moisturizers to help with vaginal dryness and improve comfort during sex.  Keep all follow-up visits as told by your health care provider. This is important. This includes any group therapy or counseling. Contact a health care provider if:  You are still having menstrual periods after age 55.  You have pain during sex.  You have not had a period for 12 months and you develop vaginal bleeding. Get help right away if:  You have: ? Severe depression. ? Excessive vaginal bleeding. ? Pain when you urinate. ? A fast or irregular heart beat (palpitations). ? Severe headaches. ? Abdomen (abdominal) pain or severe indigestion.  You fell and you think you have a broken bone.  You develop leg or chest pain.  You develop vision problems.  You feel a lump in your breast. Summary  Menopause is the normal  time of life when menstrual periods stop completely. It is usually confirmed by 12 months without a menstrual period.  The transition to menopause (perimenopause) most often happens between the ages of 45 and 55.  Symptoms can be managed through medicines, lifestyle changes, and complementary therapies such as acupuncture.  Eat a balanced diet that is rich in nutrients to promote bone health and heart health and to manage symptoms during menopause. This information is not intended to replace advice given to you by your health care provider. Make sure you discuss any questions you have with your health care provider. Document Revised: 01/21/2017 Document Reviewed: 03/13/2016 Elsevier Patient Education  2020 [employer] Inc[employer]  

## 2020-01-14 LAB — SURGICAL PATHOLOGY

## 2020-01-15 ENCOUNTER — Telehealth: Payer: Self-pay | Admitting: Obstetrics & Gynecology

## 2020-01-15 NOTE — Telephone Encounter (Signed)
-----   Message from Gae Dry, MD sent at 01/15/2020 11:50 AM EST ----- Sch f/u (PAP) 6 months.

## 2020-01-21 ENCOUNTER — Encounter: Payer: Self-pay | Admitting: Family Medicine

## 2020-01-21 NOTE — Telephone Encounter (Signed)
Left message to call back  

## 2020-01-21 NOTE — Telephone Encounter (Signed)
Left message to call office

## 2020-01-22 ENCOUNTER — Telehealth: Payer: Self-pay | Admitting: Family Medicine

## 2020-01-22 ENCOUNTER — Other Ambulatory Visit: Payer: Self-pay

## 2020-01-22 ENCOUNTER — Ambulatory Visit (INDEPENDENT_AMBULATORY_CARE_PROVIDER_SITE_OTHER): Payer: BC Managed Care – PPO | Admitting: Family

## 2020-01-22 ENCOUNTER — Encounter: Payer: Self-pay | Admitting: Family

## 2020-01-22 VITALS — BP 120/62 | HR 67 | Temp 97.7°F | Ht 66.0 in | Wt 133.8 lb

## 2020-01-22 DIAGNOSIS — H9201 Otalgia, right ear: Secondary | ICD-10-CM | POA: Diagnosis not present

## 2020-01-22 DIAGNOSIS — H9209 Otalgia, unspecified ear: Secondary | ICD-10-CM | POA: Insufficient documentation

## 2020-01-22 LAB — CBC WITH DIFFERENTIAL/PLATELET
Basophils Absolute: 0 10*3/uL (ref 0.0–0.1)
Basophils Relative: 0.6 % (ref 0.0–3.0)
Eosinophils Absolute: 0.1 10*3/uL (ref 0.0–0.7)
Eosinophils Relative: 1.5 % (ref 0.0–5.0)
HCT: 37 % (ref 36.0–46.0)
Hemoglobin: 12.2 g/dL (ref 12.0–15.0)
Lymphocytes Relative: 30.6 % (ref 12.0–46.0)
Lymphs Abs: 1.5 10*3/uL (ref 0.7–4.0)
MCHC: 32.9 g/dL (ref 30.0–36.0)
MCV: 91.9 fl (ref 78.0–100.0)
Monocytes Absolute: 0.2 10*3/uL (ref 0.1–1.0)
Monocytes Relative: 4.5 % (ref 3.0–12.0)
Neutro Abs: 3.1 10*3/uL (ref 1.4–7.7)
Neutrophils Relative %: 62.8 % (ref 43.0–77.0)
Platelets: 243 10*3/uL (ref 150.0–400.0)
RBC: 4.03 Mil/uL (ref 3.87–5.11)
RDW: 13.2 % (ref 11.5–15.5)
WBC: 4.9 10*3/uL (ref 4.0–10.5)

## 2020-01-22 LAB — SEDIMENTATION RATE: Sed Rate: 15 mm/hr (ref 0–30)

## 2020-01-22 LAB — C-REACTIVE PROTEIN: CRP: <1 mg/dL (ref 0.5–20.0)

## 2020-01-22 LAB — POCT RAPID STREP A (OFFICE): Rapid Strep A Screen: NEGATIVE

## 2020-01-22 MED ORDER — VALACYCLOVIR HCL 1 G PO TABS: 1000.0000 mg | ORAL_TABLET | Freq: Three times a day (TID) | ORAL | 0 refills | Status: DC

## 2020-01-22 NOTE — Progress Notes (Signed)
Subjective:    Patient ID: Jenna Davidson, female    DOB: 02-02-1956, 64 y.o.   MRN: [id number]  CC: Jenna Davidson is a 64 y.o. female who presents today for an acute visit.    HPI: Complains of right throat, right ear and sore area right side of head.  Symptoms started 3 days ago with pain on right side of throat and then migrated to right ear. Ear doesn't hurt constantly however occasional sharp pain which radiates to right side of head when swallows. Pain posterior to right ear feels 'tingly'.   Pain is not worse pain of her life. More bothersome and she doesn't know why all right sided.   Endorses sore throat, nasal congestion  No jaw pain. Doesn't think she grinds her teeth. 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   No fever, cough, sob ,vision changes, hearing loss, changes to taste/ smell, facial pain,temporal pain, ear popping.   H/o seasonal allergies  Using nasal saline spray to help with nasal congestion and PND. tylenol with relief.   Leaves tomorrow for mountains.  H/o tonsillectomy   H/o  Migraine - No aura.   Typically behind right eye. These symptoms do not resemble typical migraines. takes maxalt for rescue.   Fully vaccinated for covid, shingles.  No covid contacts  HISTORY:  Past Medical History:  Diagnosis Date  . Allergy   . Cancer ([place]) 2778   follicular papillary carcinoma; s/p thyroidectomy  . Chicken pox   . Heart murmur   . Hypothyroidism   . Kidney stones   . Migraines    Past Surgical History:  Procedure Laterality Date  . AUGMENTATION MAMMAPLASTY Bilateral 2004   saline implants. left breast sits higher than the right.   Marland Kitchen BREAST ENHANCEMENT SURGERY  2006   breast augmentation   . TONSILLECTOMY AND ADENOIDECTOMY  1967   Family History  Problem Relation Age of Onset  . Alcohol abuse Father   . Lung cancer Father   . Heart disease Father   . Diabetes Maternal Grandmother   . Diabetes Maternal Grandfather   . Uterine cancer  Paternal Grandmother   . Heart disease Paternal Grandmother   . Heart disease Paternal Grandfather   . Stroke Paternal Grandfather     Allergies: Patient has no known allergies. Current Outpatient Medications on File Prior to Visit  Medication Sig Dispense Refill  . Acetaminophen (TYLENOL PO) Take by mouth as needed.    . chlorpheniramine (CHLOR-TRIMETON) 4 MG tablet Take 4 mg by mouth as needed for allergies.    . Cholecalciferol (D3 ADULT PO) Take 5,000 Units by mouth.    . cloNIDine (CATAPRES - DOSED IN MG/24 HR) 0.1 mg/24hr patch Place 1 patch (0.1 mg total) onto the skin once a week. 4 patch 11  . Cyanocobalamin 5000 MCG CAPS Take by mouth.    . cyclobenzaprine (FLEXERIL) 5 MG tablet Take 1 tablet (5 mg total) by mouth 3 (three) times daily as needed for muscle spasms. 30 tablet 3  . Ibuprofen-Diphenhydramine Cit (IBUPROFEN PM PO) Take by mouth as needed.    Marland Kitchen levothyroxine (SYNTHROID) 125 MCG tablet Take 1 tablet (125 mcg total) by mouth daily before breakfast. 90 tablet 3  . LORazepam (ATIVAN) 0.5 MG tablet Take 1 tablet (0.5 mg total) by mouth as needed for anxiety. 30 tablet 0  . Magnesium 500 MG CAPS Take by mouth.    . Naproxen Sodium (ALEVE PO) Take by mouth.    Marland Kitchen  Omega-3 Fatty Acids (OMEGA 3 PO) Take 1,000 mg by mouth.    . promethazine (PHENERGAN) 25 MG tablet Take 1 tablet (25 mg total) by mouth every 8 (eight) hours as needed for nausea. 30 tablet 0  . rizatriptan (MAXALT) 5 MG tablet TAKE 1-2 TABLETS AT ONSET OF MIGRAINE. MAY REPEAT IN 2 HOURS (ONCE) 10 tablet 7   No current facility-administered medications on file prior to visit.    Social History   Tobacco Use  . Smoking status: Never Smoker  . Smokeless tobacco: Never Used  Vaping Use  . Vaping Use: Never used  Substance Use Topics  . Alcohol use: Yes    Alcohol/week: 0.0 - 1.0 standard drinks  . Drug use: No    Review of Systems  Constitutional: Negative for chills and fever.  HENT: Positive for  congestion and sore throat. Negative for dental problem, hearing loss, sinus pressure and trouble swallowing.   Eyes: Negative for photophobia and visual disturbance.  Respiratory: Negative for cough.   Cardiovascular: Negative for chest pain and palpitations.  Gastrointestinal: Negative for nausea and vomiting.      Objective:    BP 120/62   Pulse 67   Temp 97.7 F (36.5 C)   Ht 5\' 6"  (1.676 m)   Wt 133 lb 12.8 oz (60.7 kg)   SpO2 98%   BMI 21.60 kg/m    Physical Exam Vitals reviewed.  Constitutional:      Appearance: She is well-developed.  HENT:     Head: Normocephalic and atraumatic.     Jaw: No tenderness or pain on movement.      Comments: Tingling sensation as marked on diagram posterior scalp. No vesicular lesions present. Skin intact.  No pain with bilateral palpation of temporal areas.     Right Ear: Hearing, tympanic membrane, ear canal and external ear normal. No decreased hearing noted. No drainage, swelling or tenderness. No middle ear effusion. No foreign body. Tympanic membrane is not erythematous or bulging.     Left Ear: Hearing, tympanic membrane, ear canal and external ear normal. No decreased hearing noted. No drainage, swelling or tenderness.  No middle ear effusion. No foreign body. Tympanic membrane is not erythematous or bulging.     Nose: Nose normal. No rhinorrhea.     Right Sinus: No maxillary sinus tenderness or frontal sinus tenderness.     Left Sinus: No maxillary sinus tenderness or frontal sinus tenderness.     Mouth/Throat:     Pharynx: Uvula midline. No oropharyngeal exudate or posterior oropharyngeal erythema.     Tonsils: No tonsillar abscesses.  Eyes:     Conjunctiva/sclera: Conjunctivae normal.     Pupils: Pupils are equal, round, and reactive to light.     Comments: Fundus normal bilaterally.   Neck:     Comments: No  Neck tenderness with palpation Cardiovascular:     Rate and Rhythm: Normal rate and regular rhythm.     Pulses:  Normal pulses.     Heart sounds: Normal heart sounds.  Pulmonary:     Effort: Pulmonary effort is normal.     Breath sounds: Normal breath sounds. No wheezing, rhonchi or rales.  Lymphadenopathy:     Head:     Right side of head: No submental, submandibular, tonsillar, preauricular, posterior auricular or occipital adenopathy.     Left side of head: No submental, submandibular, tonsillar, preauricular, posterior auricular or occipital adenopathy.     Cervical: No cervical adenopathy.     Right  cervical: No superficial, deep or posterior cervical adenopathy.    Left cervical: No superficial, deep or posterior cervical adenopathy.  Skin:    General: Skin is warm and dry.  Neurological:     Mental Status: She is alert.     Cranial Nerves: No cranial nerve deficit.     Sensory: No sensory deficit.     Deep Tendon Reflexes:     Reflex Scores:      Bicep reflexes are 2+ on the right side and 2+ on the left side.      Patellar reflexes are 2+ on the right side and 2+ on the left side.    Comments: Grip equal and strong bilateral upper extremities. Gait strong and steady. Able to perform rapid alternating movement without difficulty.   Psychiatric:        Speech: Speech normal.        Behavior: Behavior normal.        Thought Content: Thought content normal.        Assessment & Plan:   Problem List Items Addressed This Visit      Other   Ear pain - Primary    Unusual presentation. Patient is not toxic appearance and HEENT, neurologic exam normal. Strep negative. Right sided vague symptoms and tingling raises my concern for herpes zoster , although she is fully vaccinated. No vision changes, temporal pain so less concern for GCA although pending labs. Considering eustachian tube dysfunction  However due to location proximal to ear, eye, patient and I agreed to go ahead and start valtrex with close follow up. She understands to seek in person evaluation on her trip tomorrow if symptoms  were to worsen. Otherwise follow up with me in 3 days.       Relevant Medications   valACYclovir (VALTREX) 1000 MG tablet   Other Relevant Orders   C-reactive protein   Sedimentation rate   CBC with Differential/Platelet   POCT rapid strep A (Completed)         I am having Stayce Clanton start on valACYclovir. I am also having her maintain her chlorpheniramine, Ibuprofen-Diphenhydramine Cit (IBUPROFEN PM PO), Acetaminophen (TYLENOL PO), Naproxen Sodium (ALEVE PO), Omega-3 Fatty Acids (OMEGA 3 PO), Cholecalciferol (D3 ADULT PO), Cyanocobalamin, Magnesium, promethazine, rizatriptan, cyclobenzaprine, levothyroxine, LORazepam, and cloNIDine.   Meds ordered this encounter  Medications  . valACYclovir (VALTREX) 1000 MG tablet    Sig: Take 1 tablet (1,000 mg total) by mouth 3 (three) times daily.    Dispense:  21 tablet    Refill:  0    Order Specific Question:   Supervising Provider    Answer:   Crecencio Mc [[id number]]    Return precautions given.   Risks, benefits, and alternatives of the medications and treatment plan prescribed today were discussed, and patient expressed understanding.   Education regarding symptom management and diagnosis given to patient on AVS.  Continue to follow with Leone Haven, MD for routine health maintenance.   Jenna Davidson and I agreed with plan.   Mable Paris, FNP

## 2020-01-22 NOTE — Assessment & Plan Note (Addendum)
Unusual presentation. Patient is not toxic appearance and HEENT, neurologic exam normal. Strep negative. Right sided vague symptoms and tingling raises my concern for herpes zoster , although she is fully vaccinated. No vision changes, temporal pain so less concern for GCA although pending labs. Considering eustachian tube dysfunction  However due to location proximal to ear, eye, patient and I agreed to go ahead and start valtrex with close follow up. She understands to seek in person evaluation on her trip tomorrow if symptoms were to worsen. Otherwise follow up with me in 3 days.

## 2020-01-22 NOTE — Patient Instructions (Addendum)
Concern for early shingles Please start valtrex and stay hypervigilant for new symptoms, blister like lesions and in particular changes to hearing, vision.   We will have virtual follow up on Friday at noon.   Please as discussed go to urgent care in [place] if any acute symptoms.    Shingles  Shingles, which is also known as herpes zoster, is an infection that causes a painful skin rash and fluid-filled blisters. It is caused by a virus. Shingles only develops in people who:  Have had chickenpox.  Have been given a medicine to protect against chickenpox (have been vaccinated). Shingles is rare in this group. What are the causes? Shingles is caused by varicella-zoster virus (VZV). This is the same virus that causes chickenpox. After a person is exposed to VZV, the virus stays in the body in an inactive (dormant) state. Shingles develops if the virus is reactivated. This can happen many years after the first (initial) exposure to VZV. It is not known what causes this virus to be reactivated. What increases the risk? People who have had chickenpox or received the chickenpox vaccine are at risk for shingles. Shingles infection is more common in people who:  Are older than age 61.  Have a weakened disease-fighting system (immune system), such as people with: ? HIV. ? AIDS. ? Cancer.  Are taking medicines that weaken the immune system, such as transplant medicines.  Are experiencing a lot of stress. What are the signs or symptoms? Early symptoms of this condition include itching, tingling, and pain in an area on your skin. Pain may be described as burning, stabbing, or throbbing. A few days or weeks after early symptoms start, a painful red rash appears. The rash is usually on one side of the body and has a band-like or belt-like pattern. The rash eventually turns into fluid-filled blisters that break open, change into scabs, and dry up in about 2-3 weeks. At any time during the  infection, you may also develop:  A fever.  Chills.  A headache.  An upset stomach. How is this diagnosed? This condition is diagnosed with a skin exam. Skin or fluid samples may be taken from the blisters before a diagnosis is made. These samples are examined under a microscope or sent to a lab for testing. How is this treated? The rash may last for several weeks. There is not a specific cure for this condition. Your health care provider will probably prescribe medicines to help you manage pain, recover more quickly, and avoid long-term problems. Medicines may include:  Antiviral drugs.  Anti-inflammatory drugs.  Pain medicines.  Anti-itching medicines (antihistamines). If the area involved is on your face, you may be referred to a specialist, such as an eye doctor (ophthalmologist) or an ear, nose, and throat (ENT) doctor (otolaryngologist) to help you avoid eye problems, chronic pain, or disability. Follow these instructions at home: Medicines  Take over-the-counter and prescription medicines only as told by your health care provider.  Apply an anti-itch cream or numbing cream to the affected area as told by your health care provider. Relieving itching and discomfort   Apply cold, wet cloths (cold compresses) to the area of the rash or blisters as told by your health care provider.  Cool baths can be soothing. Try adding baking soda or dry oatmeal to the water to reduce itching. Do not bathe in hot water. Blister and rash care  Keep your rash covered with a loose bandage (dressing). Wear loose-fitting clothing to help  ease the pain of material rubbing against the rash.  Keep your rash and blisters clean by washing the area with mild soap and cool water as told by your health care provider.  Check your rash every day for signs of infection. Check for: ? More redness, swelling, or pain. ? Fluid or blood. ? Warmth. ? Pus or a bad smell.  Do not scratch your rash or pick  at your blisters. To help avoid scratching: ? Keep your fingernails clean and cut short. ? Wear gloves or mittens while you sleep, if scratching is a problem. General instructions  Rest as told by your health care provider.  Keep all follow-up visits as told by your health care provider. This is important.  Wash your hands often with soap and water. If soap and water are not available, use hand sanitizer. Doing this lowers your chance of getting a bacterial skin infection.  Before your blisters change into scabs, your shingles infection can cause chickenpox in people who have never had it or have never been vaccinated against it. To prevent this from happening, avoid contact with other people, especially: ? Babies. ? Pregnant women. ? Children who have eczema. ? Elderly people who have transplants. ? People who have chronic illnesses, such as cancer or AIDS. Contact a health care provider if:  Your pain is not relieved with prescribed medicines.  Your pain does not get better after the rash heals.  You have signs of infection in the rash area, such as: ? More redness, swelling, or pain around the rash. ? Fluid or blood coming from the rash. ? The rash area feeling warm to the touch. ? Pus or a bad smell coming from the rash. Get help right away if:  The rash is on your face or nose.  You have facial pain, pain around your eye area, or loss of feeling on one side of your face.  You have difficulty seeing.  You have ear pain or have ringing in your ear.  You have a loss of taste.  Your condition gets worse. Summary  Shingles, which is also known as herpes zoster, is an infection that causes a painful skin rash and fluid-filled blisters.  This condition is diagnosed with a skin exam. Skin or fluid samples may be taken from the blisters and examined before the diagnosis is made.  Keep your rash covered with a loose bandage (dressing). Wear loose-fitting clothing to help  ease the pain of material rubbing against the rash.  Before your blisters change into scabs, your shingles infection can cause chickenpox in people who have never had it or have never been vaccinated against it. This information is not intended to replace advice given to you by your health care provider. Make sure you discuss any questions you have with your health care provider. Document Revised: 06/02/2018 Document Reviewed: 10/13/2016 Elsevier Patient Education  2020 [employer].

## 2020-01-22 NOTE — Telephone Encounter (Signed)
Noted  

## 2020-01-22 NOTE — Telephone Encounter (Signed)
I schedule this patient for 10:00 am to day she is a Adult nurse patient.

## 2020-01-24 ENCOUNTER — Encounter: Payer: Self-pay | Admitting: Family Medicine

## 2020-01-25 ENCOUNTER — Telehealth: Payer: BC Managed Care – PPO | Admitting: Family

## 2020-01-28 NOTE — Telephone Encounter (Signed)
LMTCB

## 2020-01-29 ENCOUNTER — Encounter: Payer: Self-pay | Admitting: Family

## 2020-01-29 ENCOUNTER — Telehealth (INDEPENDENT_AMBULATORY_CARE_PROVIDER_SITE_OTHER): Payer: BC Managed Care – PPO | Admitting: Family

## 2020-01-29 DIAGNOSIS — H9201 Otalgia, right ear: Secondary | ICD-10-CM | POA: Diagnosis not present

## 2020-01-29 NOTE — Progress Notes (Signed)
Virtual Visit via Video Note  I connected with@  on 01/29/20 at  9:00 AM EST by a video enabled telemedicine application and verified that I am speaking with the correct person using two identifiers.  Location patient: home Location provider:work  Persons participating in the virtual visit: patient, provider  I discussed the limitations of evaluation and management by telemedicine and the availability of in person appointments. The patient expressed understanding and agreed to proceed.   HPI: Feels well today. At work and enjoyed nice mountain vacation.  Right ear pain resolved. Occasional tingling of right scalp. Slight HA today which 'is not unusual for me' otherwise feels well.   No rash, fever, temporal pain,  congestion, facial pain, ear discharge. Last day of valtrex today.      ROS: See pertinent positives and negatives per HPI.    EXAM:  VITALS per patient if applicable: Ht 5\' 6"  (1.676 m)   Wt 133 lb (60.3 kg)   BMI 21.47 kg/m  BP Readings from Last 3 Encounters:  01/22/20 120/62  01/10/20 118/80  12/03/19 118/80   Wt Readings from Last 3 Encounters:  01/29/20 133 lb (60.3 kg)  01/22/20 133 lb 12.8 oz (60.7 kg)  01/10/20 134 lb (60.8 kg)    GENERAL: alert, oriented, appears well and in no acute distress  HEENT: atraumatic, conjunttiva clear, no obvious abnormalities on inspection of external nose and ears  NECK: normal movements of the head and neck  LUNGS: on inspection no signs of respiratory distress, breathing rate appears normal, no obvious gross SOB, gasping or wheezing  CV: no obvious cyanosis  MS: moves all visible extremities without noticeable abnormality  PSYCH/NEURO: pleasant and cooperative, no obvious depression or anxiety, speech and thought processing grossly intact  ASSESSMENT AND PLAN:  Discussed the following assessment and plan:  Problem List Items Addressed This Visit      Other   Ear pain    Near resolution. Patient well  appearing today and at work. We agreed still likely to have been blunted herpetic presentation and she will complete valtrex. No symptoms to suggest bacterial infection, otitis media, cellulitis at this time to warrant antibiotics.  She will monitor symptoms for complete resolution and let me know if not so we can pursue ENT consult; she politely declines ENT consult at this time.          -we discussed possible serious and likely etiologies, options for evaluation and workup, limitations of telemedicine visit vs in person visit, treatment, treatment risks and precautions. Pt prefers to treat via telemedicine empirically rather then risking or undertaking an in person visit at this moment.  .   I discussed the assessment and treatment plan with the patient. The patient was provided an opportunity to ask questions and all were answered. The patient agreed with the plan and demonstrated an understanding of the instructions.   The patient was advised to call back or seek an in-person evaluation if the symptoms worsen or if the condition fails to improve as anticipated.   Mable Paris, FNP

## 2020-01-29 NOTE — Assessment & Plan Note (Addendum)
Near resolution. Patient well appearing today and at work. We agreed still likely to have been blunted herpetic presentation and she will complete valtrex. No symptoms to suggest bacterial infection, otitis media, cellulitis at this time to warrant antibiotics.  She will monitor symptoms for complete resolution and let me know if not so we can pursue ENT consult; she politely declines ENT consult at this time.

## 2020-02-20 ENCOUNTER — Encounter: Payer: Self-pay | Admitting: Family Medicine

## 2020-02-21 ENCOUNTER — Other Ambulatory Visit: Payer: Self-pay

## 2020-02-21 ENCOUNTER — Ambulatory Visit (INDEPENDENT_AMBULATORY_CARE_PROVIDER_SITE_OTHER): Payer: BC Managed Care – PPO | Admitting: Obstetrics & Gynecology

## 2020-02-21 ENCOUNTER — Encounter: Payer: Self-pay | Admitting: Obstetrics & Gynecology

## 2020-02-21 VITALS — Ht 66.5 in | Wt 130.0 lb

## 2020-02-21 DIAGNOSIS — R61 Generalized hyperhidrosis: Secondary | ICD-10-CM

## 2020-02-21 MED ORDER — PROMETHAZINE HCL 25 MG PO TABS: 25.0000 mg | ORAL_TABLET | Freq: Three times a day (TID) | ORAL | 0 refills | Status: DC | PRN

## 2020-02-21 NOTE — Progress Notes (Signed)
Virtual Visit via Telephone Note  I connected with Jenna Davidson on 02/21/20 at 11:00 AM EST by telephone and verified that I am speaking with the correct person using two identifiers.  Location: Patient: Home Provider: Office   I discussed the limitations, risks, security and privacy concerns of performing an evaluation and management service by telephone and the availability of in person appointments. I also discussed with the patient that there may be a patient responsible charge related to this service. The patient expressed understanding and agreed to proceed.   History of Present Illness:   Jenna Davidson is a 64 y.o. who was started on Clonidine 0.1 mg pacth weekly approximately 6 weeks ago. Since that time, she states that her symptoms are improving somewhat (not complete).  Mostly has night sweats.  PMHx: She  has a past medical history of Allergy, Cancer (HCC) (1983), Chicken pox, Heart murmur, Hypothyroidism, Kidney stones, and Migraines. Also,  has a past surgical history that includes Breast enhancement surgery (2006); Tonsillectomy and adenoidectomy (1967); and Augmentation mammaplasty (Bilateral, 2004)., family history includes Alcohol abuse in her father; Diabetes in her maternal grandfather and maternal grandmother; Heart disease in her father, paternal grandfather, and paternal grandmother; Lung cancer in her father; Stroke in her paternal grandfather; Uterine cancer in her paternal grandmother.,  reports that she has never smoked. She has never used smokeless tobacco. She reports current alcohol use. She reports that she does not use drugs. Current Meds  Medication Sig   Acetaminophen (TYLENOL PO) Take by mouth as needed.   chlorpheniramine (CHLOR-TRIMETON) 4 MG tablet Take 4 mg by mouth as needed for allergies.   Cholecalciferol (D3 ADULT PO) Take 5,000 Units by mouth.   cloNIDine (CATAPRES - DOSED IN MG/24 HR) 0.1 mg/24hr patch Place 1 patch (0.1 mg total) onto the skin  once a week.   Cyanocobalamin 5000 MCG CAPS Take by mouth.   cyclobenzaprine (FLEXERIL) 5 MG tablet Take 1 tablet (5 mg total) by mouth 3 (three) times daily as needed for muscle spasms.   Ibuprofen-Diphenhydramine Cit (IBUPROFEN PM PO) Take by mouth as needed.   levothyroxine (SYNTHROID) 125 MCG tablet Take 1 tablet (125 mcg total) by mouth daily before breakfast.   LORazepam (ATIVAN) 0.5 MG tablet Take 1 tablet (0.5 mg total) by mouth as needed for anxiety.   Magnesium 500 MG CAPS Take by mouth.   Naproxen Sodium (ALEVE PO) Take by mouth.   Omega-3 Fatty Acids (OMEGA 3 PO) Take 1,000 mg by mouth.   promethazine (PHENERGAN) 25 MG tablet Take 1 tablet (25 mg total) by mouth every 8 (eight) hours as needed for nausea.   rizatriptan (MAXALT) 5 MG tablet TAKE 1-2 TABLETS AT ONSET OF MIGRAINE. MAY REPEAT IN 2 HOURS (ONCE)   valACYclovir (VALTREX) 1000 MG tablet Take 1 tablet (1,000 mg total) by mouth 3 (three) times daily.  . Also, has No Known Allergies..  Review of Systems  All other systems reviewed and are negative.    Observations/Objective: No exam today, due to telephone eVisit due to [facility] virus restriction on elective visits and procedures.  Prior visits reviewed along with ultrasounds/labs as indicated.  Assessment and Plan:   ICD-10-CM   1. Night sweats  R61   Cont Clonidine for now, may consider dose change if subpar response Prefers to stay away from ERT if possible  Follow Up Instructions: PAP due in 5 mos (prior [facility])   I discussed the assessment and treatment plan with the patient. The  patient was provided an opportunity to ask questions and all were answered. The patient agreed with the plan and demonstrated an understanding of the instructions.   The patient was advised to call back or seek an in-person evaluation if the symptoms worsen or if the condition fails to improve as anticipated.  I provided 15 minutes of non-face-to-face time during this  encounter.   Letitia Libra, MD

## 2020-04-04 ENCOUNTER — Encounter: Payer: Self-pay | Admitting: Family Medicine

## 2020-04-04 ENCOUNTER — Other Ambulatory Visit: Payer: Self-pay | Admitting: Obstetrics & Gynecology

## 2020-04-04 DIAGNOSIS — Z1231 Encounter for screening mammogram for malignant neoplasm of breast: Secondary | ICD-10-CM

## 2020-04-04 DIAGNOSIS — Z20822 Contact with and (suspected) exposure to covid-19: Secondary | ICD-10-CM | POA: Diagnosis not present

## 2020-04-05 ENCOUNTER — Encounter: Payer: Self-pay | Admitting: Family Medicine

## 2020-04-07 NOTE — Telephone Encounter (Signed)
Griffee, Cyndi Lennert, MD 3 days ago      I tested positive for Covid this morning and thought I should let you know. My symptoms started yesterday. I have been vaccinated and received a booster. So far head congestion, headache, some dry cough, no fever. I was up through the night with diarrhea.    I will self quarantine through Monday which will be 5 days.  Is there anything else I should do? Report?

## 2020-04-07 NOTE — Telephone Encounter (Signed)
Please get this submitted to the health department.

## 2020-04-07 NOTE — Telephone Encounter (Signed)
Added to additional Patient message for same date.

## 2020-04-08 ENCOUNTER — Telehealth (INDEPENDENT_AMBULATORY_CARE_PROVIDER_SITE_OTHER): Payer: BC Managed Care – PPO | Admitting: Internal Medicine

## 2020-04-08 ENCOUNTER — Encounter: Payer: Self-pay | Admitting: Internal Medicine

## 2020-04-08 ENCOUNTER — Other Ambulatory Visit: Payer: Self-pay

## 2020-04-08 ENCOUNTER — Telehealth: Payer: Self-pay

## 2020-04-08 VITALS — Ht 66.5 in | Wt 135.0 lb

## 2020-04-08 DIAGNOSIS — U071 COVID-19: Secondary | ICD-10-CM

## 2020-04-08 DIAGNOSIS — J0110 Acute frontal sinusitis, unspecified: Secondary | ICD-10-CM

## 2020-04-08 MED ORDER — PREDNISONE 20 MG PO TABS: 40.0000 mg | ORAL_TABLET | Freq: Every day | ORAL | 0 refills | Status: DC

## 2020-04-08 MED ORDER — AZITHROMYCIN 250 MG PO TABS: ORAL_TABLET | ORAL | 0 refills | Status: DC

## 2020-04-08 NOTE — Progress Notes (Signed)
Virtual Visit via Video Note  I connected with Jenna Davidson  on 04/08/20 at  1:00 PM EST by a video enabled telemedicine application and verified that I am speaking with the correct person using two identifiers.  Location patient: home, [place] Location provider:work or home office Persons participating in the virtual visit: patient, provider  I discussed the limitations of evaluation and management by telemedicine and the availability of in person appointments. The patient expressed understanding and agreed to proceed.   HPI: Exposure to covid 19 contact 1 tested + 04/03/20 after having been on ski trip she took home test negative and 04/04/20 home test + but employer made her have official covid 19 test she did and tested +, contact #2 tested + 04/05/20 she had h/a, sinus pressure pain left sided (still there), diarrhea resolved, cough resolved like a tickle in her throat and some fatigue and brain fog. No fever, cough  She tried netti pot, prn Tylenol dayquil/nyquil cold and flu and still has left sided sinus pain/congestion, PND  : -COVID-19 vaccine status: 3/3 utd  ROS: See pertinent positives and negatives per HPI.  Past Medical History:  Diagnosis Date  . Allergy   . Cancer ([place]) 4259   follicular papillary carcinoma; s/p thyroidectomy  . Chicken pox   . COVID-19    04/04/20  . Heart murmur   . Hypothyroidism   . Kidney stones   . Migraines     Past Surgical History:  Procedure Laterality Date  . AUGMENTATION MAMMAPLASTY Bilateral 2004   saline implants. left breast sits higher than the right.   [facility] BREAST ENHANCEMENT SURGERY  2006   breast augmentation   . TONSILLECTOMY AND ADENOIDECTOMY  1967     Current Outpatient Medications:  .  azithromycin (ZITHROMAX) 250 MG tablet, 2 pills day 1 and 1 pill day 2-5, Disp: 6 tablet, Rfl: 0 .  chlorpheniramine (CHLOR-TRIMETON) 4 MG tablet, Take 4 mg by mouth as needed for allergies., Disp: , Rfl:  .  Cholecalciferol (D3 ADULT PO), Take  5,000 Units by mouth., Disp: , Rfl:  .  cloNIDine (CATAPRES - DOSED IN MG/24 HR) 0.1 mg/24hr patch, Place 1 patch (0.1 mg total) onto the skin once a week., Disp: 4 patch, Rfl: 11 .  Cyanocobalamin 5000 MCG CAPS, Take by mouth., Disp: , Rfl:  .  cyclobenzaprine (FLEXERIL) 5 MG tablet, Take 1 tablet (5 mg total) by mouth 3 (three) times daily as needed for muscle spasms., Disp: 30 tablet, Rfl: 3 .  Ibuprofen-Diphenhydramine Cit (IBUPROFEN PM PO), Take by mouth as needed., Disp: , Rfl:  .  levothyroxine (SYNTHROID) 125 MCG tablet, Take 1 tablet (125 mcg total) by mouth daily before breakfast., Disp: 90 tablet, Rfl: 3 .  LORazepam (ATIVAN) 0.5 MG tablet, Take 1 tablet (0.5 mg total) by mouth as needed for anxiety., Disp: 30 tablet, Rfl: 0 .  Magnesium 500 MG CAPS, Take by mouth., Disp: , Rfl:  .  Naproxen Sodium (ALEVE PO), Take by mouth., Disp: , Rfl:  .  Omega-3 Fatty Acids (OMEGA 3 PO), Take 1,000 mg by mouth., Disp: , Rfl:  .  predniSONE (DELTASONE) 20 MG tablet, Take 2 tablets (40 mg total) by mouth daily with breakfast. x5-10 days, Disp: 20 tablet, Rfl: 0 .  promethazine (PHENERGAN) 25 MG tablet, Take 1 tablet (25 mg total) by mouth every 8 (eight) hours as needed for nausea., Disp: 30 tablet, Rfl: 0 .  rizatriptan (MAXALT) 5 MG tablet, TAKE 1-2 TABLETS AT ONSET  OF MIGRAINE. MAY REPEAT IN 2 HOURS (ONCE), Disp: 10 tablet, Rfl: 7 .  valACYclovir (VALTREX) 1000 MG tablet, Take 1 tablet (1,000 mg total) by mouth 3 (three) times daily., Disp: 21 tablet, Rfl: 0 .  Acetaminophen (TYLENOL PO), Take by mouth as needed. (Patient not taking: Reported on 04/08/2020), Disp: , Rfl:   EXAM:  VITALS per patient if applicable:  GENERAL: alert, oriented, appears well and in no acute distress  HEENT: atraumatic, conjunttiva clear, no obvious abnormalities on inspection of external nose and ears  NECK: normal movements of the head and neck  LUNGS: on inspection no signs of respiratory distress, breathing  rate appears normal, no obvious gross SOB, gasping or wheezing  CV: no obvious cyanosis  MS: moves all visible extremities without noticeable abnormality  PSYCH/NEURO: pleasant and cooperative, no obvious depression or anxiety, speech and thought processing grossly intact  ASSESSMENT AND PLAN:  Discussed the following assessment and plan:  Acute non-recurrent frontal sinusitis - Plan: predniSONE (DELTASONE) 20 MG tablet, azithromycin (ZITHROMAX) 250 MG tablet  COVID-19 There is no medication other than over the counter meds:  Mucinex dm green label for cough.  Vitamin C 1000 mg daily.  Vitamin D3 4000 Iu (units) daily.  Zinc 100 mg daily.  Quercetin 250-500 mg 2 times per day  Elderberry  Oil of oregano  cepacol or chloroseptic spray  Warm tea with honey and lemon  Hydration  Try to eat though you dont feel like it  Tylenol or Advil  Nasal saline  Flonase  Runny nose, cough, sneezing over the counter allergy pill zyrtec, xyzal, allegra or claritin as needed at night    Monitor pulse oximeter, buy from [facility] if oxygen is less than 90 please go to the hospital.      Are you feeling really sick? Shortness of breath, cough, chest pain?, dizziness? Confusion  If so let me know  If worsening, go to hospital or [facility] clinic Urgent care for further treatment    -we discussed possible serious and likely etiologies, options for evaluation and workup, limitations of telemedicine visit vs in person visit, treatment, treatment risks and precautions.     I discussed the assessment and treatment plan with the patient. The patient was provided an opportunity to ask questions and all were answered. The patient agreed with the plan and demonstrated an understanding of the instructions.    Time spent 20 min Delorise Jackson, MD

## 2020-04-08 NOTE — Progress Notes (Signed)
Patient testes positive from Covid on 2/11 after coming back from a ski trip. Her roommate tested positive.   Patient having some congestion, headache, sinus pain/pressure, diarrhea.

## 2020-04-08 NOTE — Patient Instructions (Signed)
There is no medication other than over the counter meds:  Mucinex dm green label for cough.  Vitamin C 1000 mg daily.  Vitamin D3 4000 Iu (units) daily.  Zinc 100 mg daily.  Quercetin 250-500 mg 2 times per day   Elderberry  Oil of oregano  cepacol or chloroseptic spray  Warm tea with honey and lemon  Hydration  Try to eat though you dont feel like it   Tylenol or Advil  Nasal saline  Flonase  Runny nose, cough, sneezing over the counter allergy pill zyrtec, xyzal, allegra or claritin as needed at night    Monitor pulse oximeter, buy from [place] if oxygen is less than 90 please go to the hospital.        Are you feeling really sick? Shortness of breath, cough, chest pain?, dizziness? Confusion   If so let me know  If worsening, go to hospital or [facility] clinic Urgent care for further treatment

## 2020-04-08 NOTE — Telephone Encounter (Signed)
Pt states she has covid Now and she will schedule her mammo as soon as she's better.

## 2020-04-08 NOTE — Telephone Encounter (Signed)
-----   Message from Gae Dry, MD sent at 04/04/2020  4:30 PM EST ----- Regarding: MMG! Pt still has not had MMG done.  Please call and encourage her to do so, and even offer to help schedule the MMG if she feels this would help her.  The number is [phone] to schedule at [facility].  Do not let her hang up without a plan to have this done.  It is very important to Dr Kenton Kingfisher to have this MMG done.  Thanks for the help.

## 2020-04-29 ENCOUNTER — Other Ambulatory Visit: Payer: Self-pay | Admitting: Obstetrics & Gynecology

## 2020-05-14 ENCOUNTER — Other Ambulatory Visit: Payer: Self-pay | Admitting: Obstetrics & Gynecology

## 2020-07-14 ENCOUNTER — Ambulatory Visit: Payer: BC Managed Care – PPO | Admitting: Obstetrics & Gynecology

## 2020-07-22 ENCOUNTER — Other Ambulatory Visit: Payer: Self-pay | Admitting: Family Medicine

## 2020-07-22 NOTE — Telephone Encounter (Signed)
Last Appt with PCP 10/21 last OV in office with another provider was virtual. Last refill 12/06/19 for 30 no refills. Patient has scheduled yearly for 12/03/20.

## 2020-07-23 NOTE — Telephone Encounter (Signed)
Noted. Please call her and get her scheduled for a follow-up as it has been >6 months since was seen for this medication. For this type of medication to be refilled I require patients to be seen at least every 6 months. Once scheduled I will send in a small amount to cover until she can be seen. Thanks.

## 2020-08-12 ENCOUNTER — Ambulatory Visit
Admission: EM | Admit: 2020-08-12 | Discharge: 2020-08-12 | Disposition: A | Discharge status: Home / Self Care | Payer: BC Managed Care – PPO | Attending: Emergency Medicine | Admitting: Emergency Medicine

## 2020-08-12 ENCOUNTER — Encounter: Payer: Self-pay | Admitting: Emergency Medicine

## 2020-08-12 ENCOUNTER — Other Ambulatory Visit: Payer: Self-pay

## 2020-08-12 DIAGNOSIS — N390 Urinary tract infection, site not specified: Secondary | ICD-10-CM

## 2020-08-12 LAB — POCT URINALYSIS DIP (MANUAL ENTRY)
Bilirubin, UA: NEGATIVE
Glucose, UA: NEGATIVE mg/dL
Ketones, POC UA: NEGATIVE mg/dL
Nitrite, UA: POSITIVE — AB
Protein Ur, POC: NEGATIVE mg/dL
Spec Grav, UA: 1.015 (ref 1.010–1.025)
Urobilinogen, UA: 0.2 E.U./dL
pH, UA: 7 (ref 5.0–8.0)

## 2020-08-12 MED ORDER — CEPHALEXIN 500 MG PO CAPS: 500.0000 mg | ORAL_CAPSULE | Freq: Two times a day (BID) | ORAL | 0 refills | Status: AC

## 2020-08-12 NOTE — ED Provider Notes (Signed)
Jenna Davidson    CSN: [id number] Arrival date & time: 08/12/20  0844      History   Chief Complaint Chief Complaint  Patient presents with   Dysuria    HPI Jenna Davidson is a 65 y.o. female.  Patient presents with 1 week history of dysuria, malodorous urine, frequency, hesitancy, urgency.  She denies fever, chills, abdominal pain, back pain, pelvic pain, or other symptoms.  Treatment attempted at home with increased water intake.  Her medical history includes kidney stones, seasonal allergies, migraine headaches, cancer, thyroidectomy, hypothyroidism COVID-19 in February 2022.  The history is provided by the patient and medical records.   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 left breast sits higher than the right.    BREAST ENHANCEMENT SURGERY  2006   breast augmentation    TONSILLECTOMY AND ADENOIDECTOMY  1967    OB History     Gravida  1   Para  1   Term  1   Preterm      AB      Living  1      SAB      IAB      Ectopic      Multiple      Live Births  1            Home Medications    Prior to Admission medications   Medication Sig Start Date End Date Taking? Authorizing Provider  cephALEXin (KEFLEX) 500 MG capsule Take 1 capsule (500 mg total) by mouth 2 (two) times daily for 5 days.  08/12/20 08/17/20 Yes Sharion Balloon, NP  Acetaminophen (TYLENOL PO) Take by mouth as needed. Patient not taking: No sig reported    [provider]  azithromycin (ZITHROMAX) 250 MG tablet 2 pills day 1 and 1 pill day 2-5 04/08/20   McLean-Scocuzza, Nino Glow, MD  chlorpheniramine (CHLOR-TRIMETON) 4 MG tablet Take 4 mg by mouth as needed for allergies.    [provider]  Cholecalciferol (D3 ADULT PO) Take 5,000 Units by mouth.    [provider]  cloNIDine (CATAPRES - DOSED IN MG/24 HR) 0.1 mg/24hr patch Place 1 patch (0.1 mg total) onto the skin once a week. 01/10/20   Gae Dry, MD  Cyanocobalamin 5000 MCG CAPS Take by mouth.    [provider]  cyclobenzaprine (FLEXERIL) 5 MG tablet Take 1 tablet (5 mg total) by mouth 3 (three) times daily as needed for muscle spasms. 12/06/19   Leone Haven, MD  Ibuprofen-Diphenhydramine Cit (IBUPROFEN PM PO) Take by mouth as needed.    [provider]  levothyroxine (SYNTHROID) 125 MCG tablet Take 1 tablet (125 mcg total) by mouth daily before breakfast. 12/06/19   Leone Haven, MD  LORazepam (  ATIVAN) 0.5 MG tablet Take 1 tablet (0.5 mg total) by mouth as needed for anxiety. 12/06/19   Leone Haven, MD  Magnesium 500 MG CAPS Take by mouth.    [provider]  Naproxen Sodium (ALEVE PO) Take by mouth.    [provider]  Omega-3 Fatty Acids (OMEGA 3 PO) Take 1,000 mg by mouth.    [provider]  predniSONE (DELTASONE) 20 MG tablet Take 2 tablets (40 mg total) by mouth daily with breakfast. x5-10 days 04/08/20   McLean-Scocuzza, Nino Glow, MD  promethazine (PHENERGAN) 25 MG tablet Take 1 tablet (25 mg total) by mouth every 8 (eight) hours as needed for nausea. 02/21/20   Leone Haven, MD  rizatriptan (MAXALT) 5 MG tablet TAKE 1-2 TABLETS AT ONSET OF MIGRAINE. MAY REPEAT IN 2 HOURS (ONCE) 10/30/19   Leone Haven, MD  valACYclovir (VALTREX) 1000 MG tablet Take 1  tablet (1,000 mg total) by mouth 3 (three) times daily. 01/22/20   Burnard Hawthorne, FNP    Family History Family History  Problem Relation Age of Onset   Alcohol abuse Father    Lung cancer Father    Heart disease Father    Diabetes Maternal Grandmother    Diabetes Maternal Grandfather    Uterine cancer Paternal Grandmother    Heart disease Paternal Grandmother    Heart disease Paternal Grandfather    Stroke Paternal Grandfather     Social History Social History   Tobacco Use   Smoking status: Never   Smokeless tobacco: Never  Vaping Use   Vaping Use: Never used  Substance Use Topics   Alcohol use: Yes    Alcohol/week: 0.0 - 1.0 standard drinks   Drug use: No     Allergies   Patient has no known allergies.   Review of Systems Review of Systems  Constitutional:  Negative for chills and fever.  Respiratory:  Negative for cough and shortness of breath.   Cardiovascular:  Negative for chest pain and palpitations.  Gastrointestinal:  Negative for abdominal pain and vomiting.  Genitourinary:  Positive for dysuria, frequency and urgency. Negative for flank pain, hematuria and pelvic pain.  Skin:  Negative for color change and rash.  All other systems reviewed and are negative.   Physical Exam Triage Vital Signs ED Triage Vitals  Enc Vitals Group     BP --      Pulse --      Resp --      Temp --      Temp src --      SpO2 --      Weight 08/12/20 0859 135 lb (61.2 kg)     Height --      Head Circumference --      Peak Flow --      Pain Score 08/12/20 0858 0     Pain Loc --      Pain Edu? --      Excl. in [place]? --    No data found.  Updated Vital Signs BP 139/81 (BP Location: Left Arm)   Pulse 67   Temp 98.1 F (36.7 C) (Oral)   Resp 18   Wt 135 lb (61.2 kg)   SpO2 98%   BMI 21.46 kg/m   Visual Acuity Right Eye Distance:   Left Eye Distance:   Bilateral Distance:    Right Eye Near:   Left Eye Near:    Bilateral Near:     Physical  Exam Vitals and nursing note reviewed.  Constitutional:      General: She is not in acute distress.    Appearance: She is well-developed. She is not ill-appearing.  HENT:     Head: Normocephalic and atraumatic.     Mouth/Throat:     Mouth: Mucous membranes are moist.  Eyes:     Conjunctiva/sclera: Conjunctivae normal.  Cardiovascular:     Rate and Rhythm: Normal rate and regular rhythm.     Heart sounds: Normal heart sounds.  Pulmonary:     Effort: Pulmonary effort is normal. No respiratory distress.     Breath sounds: Normal breath sounds.  Abdominal:     Palpations: Abdomen is soft.     Tenderness: There is no abdominal tenderness. There is no right CVA tenderness, left CVA tenderness, guarding or rebound.  Musculoskeletal:     Cervical back: Neck supple.  Skin:    General: Skin is warm and dry.  Neurological:     General: No focal deficit present.     Mental Status: She is alert and oriented to person, place, and time.     Gait: Gait normal.  Psychiatric:        Mood and Affect: Mood normal.        Behavior: Behavior normal.     UC Treatments / Results  Labs (all labs ordered are listed, but only abnormal results are displayed) Labs Reviewed  POCT URINALYSIS DIP (MANUAL ENTRY) - Abnormal; Notable for the following components:      Result Value   Clarity, UA cloudy (*)    Blood, UA trace-intact (*)    Nitrite, UA Positive (*)    Leukocytes, UA Small (1+) (*)    All other components within normal limits  URINE CULTURE    EKG   Radiology No results found.  Procedures Procedures (including critical care time)  Medications Ordered in UC Medications - No data to display  Initial Impression / Assessment and Plan / UC Course  I have reviewed the triage vital signs and the nursing notes.  Pertinent labs & imaging results that were available during my care of the patient were reviewed by me and considered in my medical decision making (see chart for  details).  Urinary tract infection.  Treating with Keflex. Urine culture pending. Discussed with patient that we will call her if the urine culture shows the need to change or discontinue the antibiotic. Instructed her to follow-up with her PCP if her symptoms are not improving. Patient agrees to plan of care.      Final Clinical Impressions(s) / UC Diagnoses   Final diagnoses:  Urinary tract infection without hematuria, site unspecified     Discharge Instructions      Take the antibiotic as directed.  The urine culture is pending.  We will call you if it shows the need to change or discontinue your antibiotic.    Follow up with your primary care provider if your symptoms are not improving.         ED Prescriptions     Medication Sig Dispense Auth. Provider   cephALEXin (KEFLEX) 500 MG capsule Take 1 capsule (500 mg total) by mouth 2 (two) times daily for 5 days. 10 capsule Sharion Balloon, NP      PDMP not reviewed this encounter.   Sharion Balloon, NP 08/12/20 [phone]

## 2020-08-12 NOTE — ED Triage Notes (Signed)
Pt presents today with c/o of burning with urination, urine hesitancy/urgency with odor x 1 week.

## 2020-08-12 NOTE — Discharge Instructions (Addendum)
Take the antibiotic as directed.  The urine culture is pending.  We will call you if it shows the need to change or discontinue your antibiotic.    Follow up with your primary care provider if your symptoms are not improving.    

## 2020-08-14 LAB — URINE CULTURE: Culture: >=100000 — AB

## 2020-08-14 NOTE — Telephone Encounter (Signed)
Called patient and tried to schedule appointment no answer.

## 2020-09-30 ENCOUNTER — Encounter: Payer: Self-pay | Admitting: Family Medicine

## 2020-11-11 ENCOUNTER — Other Ambulatory Visit: Payer: Self-pay | Admitting: Family Medicine

## 2020-12-03 ENCOUNTER — Other Ambulatory Visit (HOSPITAL_COMMUNITY)
Admission: RE | Admit: 2020-12-03 | Discharge: 2020-12-03 | Disposition: A | Discharge status: Home / Self Care | Payer: BC Managed Care – PPO | Source: Ambulatory Visit | Attending: Family Medicine | Admitting: Family Medicine

## 2020-12-03 ENCOUNTER — Ambulatory Visit (INDEPENDENT_AMBULATORY_CARE_PROVIDER_SITE_OTHER): Payer: BC Managed Care – PPO | Admitting: Family Medicine

## 2020-12-03 ENCOUNTER — Other Ambulatory Visit: Payer: Self-pay

## 2020-12-03 VITALS — BP 132/86 | HR 68 | Temp 96.4°F | Ht 66.5 in | Wt 131.0 lb

## 2020-12-03 DIAGNOSIS — E785 Hyperlipidemia, unspecified: Secondary | ICD-10-CM | POA: Diagnosis not present

## 2020-12-03 DIAGNOSIS — R300 Dysuria: Secondary | ICD-10-CM | POA: Insufficient documentation

## 2020-12-03 DIAGNOSIS — E890 Postprocedural hypothyroidism: Secondary | ICD-10-CM

## 2020-12-03 DIAGNOSIS — Z124 Encounter for screening for malignant neoplasm of cervix: Secondary | ICD-10-CM

## 2020-12-03 DIAGNOSIS — Z0000 Encounter for general adult medical examination without abnormal findings: Secondary | ICD-10-CM

## 2020-12-03 DIAGNOSIS — Z23 Encounter for immunization: Secondary | ICD-10-CM | POA: Diagnosis not present

## 2020-12-03 DIAGNOSIS — Z1231 Encounter for screening mammogram for malignant neoplasm of breast: Secondary | ICD-10-CM

## 2020-12-03 DIAGNOSIS — M62838 Other muscle spasm: Secondary | ICD-10-CM

## 2020-12-03 DIAGNOSIS — F419 Anxiety disorder, unspecified: Secondary | ICD-10-CM

## 2020-12-03 DIAGNOSIS — Z1211 Encounter for screening for malignant neoplasm of colon: Secondary | ICD-10-CM

## 2020-12-03 DIAGNOSIS — G43909 Migraine, unspecified, not intractable, without status migrainosus: Secondary | ICD-10-CM

## 2020-12-03 LAB — POCT URINALYSIS DIPSTICK
Bilirubin, UA: NEGATIVE
Glucose, UA: NEGATIVE
Ketones, UA: NEGATIVE
Nitrite, UA: NEGATIVE
Protein, UA: NEGATIVE
Spec Grav, UA: 1.01 (ref 1.010–1.025)
Urobilinogen, UA: 0.2 E.U./dL
pH, UA: 6.5 (ref 5.0–8.0)

## 2020-12-03 MED ORDER — LORAZEPAM 0.5 MG PO TABS: 0.5000 mg | ORAL_TABLET | ORAL | 0 refills | Status: DC | PRN

## 2020-12-03 MED ORDER — LEVOTHYROXINE SODIUM 125 MCG PO TABS: 125.0000 ug | ORAL_TABLET | Freq: Every day | ORAL | 3 refills | Status: DC

## 2020-12-03 MED ORDER — PROMETHAZINE HCL 25 MG PO TABS: 25.0000 mg | ORAL_TABLET | Freq: Three times a day (TID) | ORAL | 0 refills | Status: DC | PRN

## 2020-12-03 MED ORDER — CYCLOBENZAPRINE HCL 5 MG PO TABS: 5.0000 mg | ORAL_TABLET | Freq: Three times a day (TID) | ORAL | 0 refills | Status: DC | PRN

## 2020-12-03 MED ORDER — RIZATRIPTAN BENZOATE 5 MG PO TABS: ORAL_TABLET | ORAL | 7 refills | Status: DC

## 2020-12-03 NOTE — Assessment & Plan Note (Signed)
Physical exam completed.  Encouraged continued exercise.  Encouraged healthy diet.  Mammogram ordered.  GI referral placed for colonoscopy.  Pap smear completed today.  She will be given a flu vaccine.  She was encouraged to get the bivalent COVID-vaccine.  Lab work as outlined.

## 2020-12-03 NOTE — Patient Instructions (Signed)
Nice to see you. We will contact you with your lab results. Please call [phone] to schedule your mammogram. GI should contact you to schedule a colonoscopy.

## 2020-12-03 NOTE — Assessment & Plan Note (Signed)
Undetermined cause.  Physical exam did not reveal any specific abnormalities.  We will check a urinalysis.

## 2020-12-03 NOTE — Assessment & Plan Note (Signed)
Stable.  Medications refilled.

## 2020-12-03 NOTE — Progress Notes (Signed)
Tommi Rumps, MD Phone: [phone]  Jenna Davidson is a 65 y.o. female who presents today for CPE.  Diet: vegetable and fruit daily, not enough protein Exercise: walks, bikes, mows yard, activity 4x/week Pap smear: due Colonoscopy: due Mammogram: due Family history-  Colon cancer: no  Breast cancer: no  Ovarian cancer: no Menses: postmenopausal Vaccines-   Flu: due  Tetanus: UTD  Shingles: UTD  COVID19: x3 HIV screening: declined previously Hep C Screening: UTD Tobacco use: no Alcohol use: socially Illicit Drug use: no Dentist: yes Ophthalmology: yes  The patient reports two UTIs this year.  She continues to have intermittent dysuria though no other urinary symptoms.  Anxiety: This is generally stable.  Traveling cars triggers it as her husband died in a motor vehicle accident.  Her mom also has dementia which triggered some anxiety.  She rarely takes the lorazepam and it does not make her drowsy.  Muscle spasms: These are very rare.  She only occasionally takes a Flexeril.  No drowsiness.  Migraines: She has not recently needed any medication for this though she does request a refill.  Active Ambulatory Problems    Diagnosis Date Noted   Postoperative hypothyroidism 07/31/2015   Hyperlipidemia 07/31/2015   Anxiety 07/31/2015   Routine general medical examination at a health care facility 11/10/2016   LGSIL on Pap smear of cervix 11/11/2017   Migraine 11/11/2017   Allergic rhinitis 12/06/2019   Ear pain 01/22/2020   Dysuria 12/03/2020   Muscle spasm 12/03/2020   Resolved Ambulatory Problems    Diagnosis Date Noted   Encounter for preventative adult health care exam with abnormal findings 07/31/2015   Bilateral hand pain 07/31/2015   Post-nasal drip 11/05/2015   Vaginal discharge 07/29/2016   Past Medical History:  Diagnosis Date   Allergy    Cancer ([place]) 1983   Chicken pox    COVID-19    Heart murmur    Hypothyroidism    Kidney stones     Migraines     Family History  Problem Relation Age of Onset   Alcohol abuse Father    Lung cancer Father    Heart disease Father    Diabetes Maternal Grandmother    Diabetes Maternal Grandfather    Uterine cancer Paternal Grandmother    Heart disease Paternal Grandmother    Heart disease Paternal Grandfather    Stroke Paternal Grandfather     Social History   Socioeconomic History   Marital status: Widowed    Spouse name: Not on file   Number of children: Not on file   Years of education: Not on file   Highest education level: Not on file  Occupational History   Not on file  Tobacco Use   Smoking status: Never   Smokeless tobacco: Never  Vaping Use   Vaping Use: Never used  Substance and Sexual Activity   Alcohol use: Yes    Alcohol/week: 0.0 - 1.0 standard drinks   Drug use: No   Sexual activity: Yes    Birth control/protection: Post-menopausal  Other Topics Concern   Not on file  Social History Narrative   Not on file   Social Determinants of Health   Financial Resource Strain: Not on file  Food Insecurity: Not on file  Transportation Needs: Not on file  Physical Activity: Not on file  Stress: Not on file  Social Connections: Not on file  Intimate Partner Violence: Not on file    ROS  General:  Negative for nexplained weight  loss, fever Skin: Negative for new or changing mole, sore that won't heal HEENT: Negative for trouble hearing, trouble seeing, ringing in ears, mouth sores, hoarseness, change in voice, dysphagia. CV:  Negative for chest pain, dyspnea, edema, palpitations Resp: Negative for cough, dyspnea, hemoptysis GI: Negative for nausea, vomiting, diarrhea, constipation, abdominal pain, melena, hematochezia. GU: Negative for dysuria, incontinence, urinary hesitance, hematuria, vaginal or penile discharge, polyuria, sexual difficulty, lumps in testicle or breasts MSK: Negative for muscle cramps or aches, joint pain or swelling Neuro: Negative  for headaches, weakness, numbness, dizziness, passing out/fainting Psych: Negative for depression, anxiety, memory problems  Objective  Physical Exam Vitals:   12/03/20 1359  BP: 132/86  Pulse: 68  Temp: (!) 96.4 F (35.8 C)  SpO2: 99%    BP Readings from Last 3 Encounters:  12/03/20 132/86  08/12/20 139/81  01/22/20 120/62   Wt Readings from Last 3 Encounters:  12/03/20 131 lb (59.4 kg)  08/12/20 135 lb (61.2 kg)  04/08/20 135 lb (61.2 kg)    Physical Exam Constitutional:      General: She is not in acute distress.    Appearance: She is not diaphoretic.  HENT:     Head: Normocephalic and atraumatic.  Eyes:     Conjunctiva/sclera: Conjunctivae normal.     Pupils: Pupils are equal, round, and reactive to light.  Cardiovascular:     Rate and Rhythm: Normal rate and regular rhythm.     Heart sounds: Normal heart sounds.  Pulmonary:     Effort: Pulmonary effort is normal.     Breath sounds: Normal breath sounds.  Abdominal:     General: Bowel sounds are normal. There is no distension.     Palpations: Abdomen is soft.     Tenderness: There is no abdominal tenderness. There is no guarding or rebound.  Genitourinary:    Comments: Leta Speller, CMA served as chaperone, normal labia, normal vaginal mucosa, physiologic discharge noted, normal-appearing cervix,, no masses on bimanual exam Lymphadenopathy:     Cervical: No cervical adenopathy.  Skin:    General: Skin is warm and dry.  Neurological:     Mental Status: She is alert.  Psychiatric:        Mood and Affect: Mood normal.     Assessment/Plan:   Problem List Items Addressed This Visit     Anxiety    Stable.  No symptoms currently.  Medications refilled.      Relevant Medications   LORazepam (ATIVAN) 0.5 MG tablet   Dysuria    Undetermined cause.  Physical exam did not reveal any specific abnormalities.  We will check a urinalysis.      Relevant Orders   POCT Urinalysis Dipstick (Completed)    Urine Culture   Urine Microscopic   Hyperlipidemia   Relevant Orders   Comp Met (CMET)   Lipid panel   Migraine    Stable.  Medications refilled.      Relevant Medications   cyclobenzaprine (FLEXERIL) 5 MG tablet   promethazine (PHENERGAN) 25 MG tablet   rizatriptan (MAXALT) 5 MG tablet   Muscle spasm    Rare issues with this.  Refill of Flexeril given.      Relevant Medications   cyclobenzaprine (FLEXERIL) 5 MG tablet   Postoperative hypothyroidism   Relevant Medications   levothyroxine (SYNTHROID) 125 MCG tablet   Other Relevant Orders   TSH   Routine general medical examination at a health care facility - Primary    Physical exam completed.  Encouraged continued exercise.  Encouraged healthy diet.  Mammogram ordered.  GI referral placed for colonoscopy.  Pap smear completed today.  She will be given a flu vaccine.  She was encouraged to get the bivalent COVID-vaccine.  Lab work as outlined.      Other Visit Diagnoses     Encounter for screening mammogram for malignant neoplasm of breast       Relevant Orders   MM 3D SCREEN BREAST BILATERAL   Colon cancer screening       Relevant Orders   Ambulatory referral to Gastroenterology   Cervical cancer screening       Relevant Orders   Cytology - PAP( [place])   Need for immunization against influenza       Relevant Orders   Flu Vaccine QUAD 64moIM (Fluarix, Fluzone & Alfiuria Quad PF) (Completed)       Return in about 1 year (around 12/03/2021) for CPE.  This visit occurred during the SARS-CoV-2 public health emergency.  Safety protocols were in place, including screening questions prior to the visit, additional usage of staff PPE, and extensive cleaning of exam room while observing appropriate contact time as indicated for disinfecting solutions.    ETommi Rumps MD [facility]

## 2020-12-03 NOTE — Assessment & Plan Note (Signed)
Rare issues with this.  Refill of Flexeril given.

## 2020-12-03 NOTE — Assessment & Plan Note (Signed)
Stable.  No symptoms currently.  Medications refilled.

## 2020-12-04 LAB — COMPREHENSIVE METABOLIC PANEL
ALT: 14 U/L (ref 0–35)
AST: 18 U/L (ref 0–37)
Albumin: 4.6 g/dL (ref 3.5–5.2)
Alkaline Phosphatase: 65 U/L (ref 39–117)
BUN: 16 mg/dL (ref 6–23)
CO2: 29 mEq/L (ref 19–32)
Calcium: 9.9 mg/dL (ref 8.4–10.5)
Chloride: 103 mEq/L (ref 96–112)
Creatinine, Ser: 0.71 mg/dL (ref 0.40–1.20)
GFR: 90.53 mL/min (ref >60.00)
Glucose, Bld: 85 mg/dL (ref 70–99)
Potassium: 4.1 mEq/L (ref 3.5–5.1)
Sodium: 140 mEq/L (ref 135–145)
Total Bilirubin: 0.4 mg/dL (ref 0.2–1.2)
Total Protein: 7.6 g/dL (ref 6.0–8.3)

## 2020-12-04 LAB — LIPID PANEL
Cholesterol: 224 mg/dL — ABNORMAL HIGH (ref 0–200)
HDL: 66.9 mg/dL (ref >39.00)
LDL Cholesterol: 132 mg/dL — ABNORMAL HIGH (ref 0–99)
NonHDL: 156.94
Total CHOL/HDL Ratio: 3
Triglycerides: 123 mg/dL (ref 0.0–149.0)
VLDL: 24.6 mg/dL (ref 0.0–40.0)

## 2020-12-04 LAB — URINE CULTURE
MICRO NUMBER:: 12493707
SPECIMEN QUALITY:: ADEQUATE

## 2020-12-04 LAB — URINALYSIS, MICROSCOPIC ONLY

## 2020-12-04 LAB — TSH: TSH: 1.12 u[IU]/mL (ref 0.35–5.50)

## 2020-12-09 LAB — CYTOLOGY - PAP
Comment: NEGATIVE
Diagnosis: NEGATIVE
High risk HPV: NEGATIVE

## 2021-03-10 ENCOUNTER — Encounter: Payer: Self-pay | Admitting: Family Medicine

## 2021-07-08 DIAGNOSIS — H10021 Other mucopurulent conjunctivitis, right eye: Secondary | ICD-10-CM | POA: Diagnosis not present

## 2021-08-05 ENCOUNTER — Ambulatory Visit (INDEPENDENT_AMBULATORY_CARE_PROVIDER_SITE_OTHER): Payer: BC Managed Care – PPO | Admitting: Internal Medicine

## 2021-08-05 ENCOUNTER — Encounter: Payer: Self-pay | Admitting: Internal Medicine

## 2021-08-05 ENCOUNTER — Telehealth: Payer: Self-pay | Admitting: Family Medicine

## 2021-08-05 VITALS — BP 130/80 | HR 69 | Temp 97.9°F | Resp 14 | Ht 66.5 in | Wt 135.0 lb

## 2021-08-05 DIAGNOSIS — R1084 Generalized abdominal pain: Secondary | ICD-10-CM

## 2021-08-05 DIAGNOSIS — R109 Unspecified abdominal pain: Secondary | ICD-10-CM

## 2021-08-05 DIAGNOSIS — B3731 Acute candidiasis of vulva and vagina: Secondary | ICD-10-CM

## 2021-08-05 DIAGNOSIS — N200 Calculus of kidney: Secondary | ICD-10-CM

## 2021-08-05 DIAGNOSIS — N3000 Acute cystitis without hematuria: Secondary | ICD-10-CM

## 2021-08-05 DIAGNOSIS — K529 Noninfective gastroenteritis and colitis, unspecified: Secondary | ICD-10-CM

## 2021-08-05 DIAGNOSIS — K5900 Constipation, unspecified: Secondary | ICD-10-CM

## 2021-08-05 MED ORDER — FLUCONAZOLE 150 MG PO TABS
150.0000 mg | ORAL_TABLET | Freq: Once | ORAL | 0 refills | Status: AC
Start: 2021-08-05 — End: 2021-08-05

## 2021-08-05 MED ORDER — CIPROFLOXACIN HCL 500 MG PO TABS: 500.0000 mg | ORAL_TABLET | Freq: Two times a day (BID) | ORAL | 0 refills | Status: DC

## 2021-08-05 NOTE — Progress Notes (Addendum)
Chief Complaint  Patient presents with   Urinary Tract Infection    Pt c/o pain,frequency, slight burning ongoing for wks. Denies any hematuria or order. Does also have flank pain which is dull & achy does have hx of kidney infections.   F/u  1. H/o uti and kidney stones c/o left flank pain 6-7/10 tried ibuprofen 800 mg bid and reduced sharp pain to 4-5/10  Slight burning and lower ab pain. No blood, no odor. Dull achy pain to sharp pain at times h/o pyelonephritis    Review of Systems  Constitutional:  Negative for fever and weight loss.  HENT:  Negative for hearing loss.   Eyes:  Negative for blurred vision.  Respiratory:  Negative for shortness of breath.   Cardiovascular:  Negative for chest pain.  Gastrointestinal:  Positive for abdominal pain. Negative for blood in stool.  Genitourinary:  Positive for dysuria.  Musculoskeletal:  Negative for falls and joint pain.  Skin:  Negative for rash.  Neurological:  Negative for headaches.  Psychiatric/Behavioral:  Negative for depression.    Past Medical History:  Diagnosis Date   Allergy    Cancer ([place]) 6203   follicular papillary carcinoma; s/p thyroidectomy   Chicken pox    COVID-19    04/04/20   Heart murmur    Hypothyroidism    Kidney stones    Migraines    Past Surgical History:  Procedure Laterality Date   AUGMENTATION MAMMAPLASTY Bilateral 2004   saline implants. left breast sits higher than the right.    BREAST ENHANCEMENT SURGERY  2006   breast augmentation    TONSILLECTOMY AND ADENOIDECTOMY  1967   Family History  Problem Relation Age of Onset   Alcohol abuse Father    Lung cancer Father    Heart disease Father    Diabetes Maternal Grandmother    Diabetes Maternal Grandfather    Uterine cancer Paternal Grandmother    Heart disease Paternal Grandmother    Heart disease Paternal Grandfather    Stroke Paternal Grandfather    Social History   Socioeconomic History   Marital status: Widowed    Spouse name:  Not on file   Number of children: Not on file   Years of education: Not on file   Highest education level: Not on file  Occupational History   Not on file  Tobacco Use   Smoking status: Never   Smokeless tobacco: Never  Vaping Use   Vaping Use: Never used  Substance and Sexual Activity   Alcohol use: Yes    Alcohol/week: 0.0 - 1.0 standard drinks of alcohol   Drug use: No   Sexual activity: Yes    Birth control/protection: Post-menopausal  Other Topics Concern   Not on file  Social History Narrative   Not on file   Social Determinants of Health   Financial Resource Strain: Not on file  Food Insecurity: Not on file  Transportation Needs: Not on file  Physical Activity: Not on file  Stress: Not on file  Social Connections: Not on file  Intimate Partner Violence: Not on file   Current Meds  Medication Sig   Acetaminophen (TYLENOL PO) Take by mouth as needed.   ciprofloxacin (CIPRO) 500 MG tablet Take 1 tablet (500 mg total) by mouth 2 (two) times daily. With food x 7 days   cyclobenzaprine (FLEXERIL) 5 MG tablet Take 1 tablet (5 mg total) by mouth 3 (three) times daily as needed for muscle spasms.   fluconazole (DIFLUCAN) 150  MG tablet Take 1 tablet (150 mg total) by mouth once for 1 dose. Repeat another dose in 3 days if needed   Ibuprofen-Diphenhydramine Cit (IBUPROFEN PM PO) Take by mouth as needed.   levothyroxine (SYNTHROID) 125 MCG tablet Take 1 tablet (125 mcg total) by mouth daily before breakfast.   LORazepam (ATIVAN) 0.5 MG tablet Take 1 tablet (0.5 mg total) by mouth as needed for anxiety.   Magnesium 500 MG CAPS Take by mouth.   Naproxen Sodium (ALEVE PO) Take by mouth.   promethazine (PHENERGAN) 25 MG tablet Take 1 tablet (25 mg total) by mouth every 8 (eight) hours as needed for nausea.   rizatriptan (MAXALT) 5 MG tablet May repeat in 2 hours if needed   No Known Allergies No results found for this or any previous visit (from the past 2160  hour(s)). Objective  Body mass index is 21.46 kg/m. Wt Readings from Last 3 Encounters:  08/05/21 135 lb (61.2 kg)  12/03/20 131 lb (59.4 kg)  08/12/20 135 lb (61.2 kg)   Temp Readings from Last 3 Encounters:  08/05/21 97.9 F (36.6 C) (Oral)  12/03/20 (!) 96.4 F (35.8 C)  08/12/20 98.1 F (36.7 C) (Oral)   BP Readings from Last 3 Encounters:  08/05/21 130/80  12/03/20 132/86  08/12/20 139/81   Pulse Readings from Last 3 Encounters:  08/05/21 69  12/03/20 68  08/12/20 67    Physical Exam Vitals and nursing note reviewed.  Constitutional:      Appearance: Normal appearance. She is well-developed and well-groomed.  HENT:     Head: Normocephalic and atraumatic.  Eyes:     Conjunctiva/sclera: Conjunctivae normal.     Pupils: Pupils are equal, round, and reactive to light.  Cardiovascular:     Rate and Rhythm: Normal rate and regular rhythm.     Heart sounds: Normal heart sounds. No murmur heard. Pulmonary:     Effort: Pulmonary effort is normal.     Breath sounds: Normal breath sounds.  Abdominal:     General: Abdomen is flat. Bowel sounds are normal.     Tenderness: There is no abdominal tenderness.  Musculoskeletal:        General: No tenderness.  Skin:    General: Skin is warm and dry.  Neurological:     General: No focal deficit present.     Mental Status: She is alert and oriented to person, place, and time. Mental status is at baseline.     Cranial Nerves: Cranial nerves 2-12 are intact.     Motor: Motor function is intact.     Coordination: Coordination is intact.     Gait: Gait is intact.  Psychiatric:        Attention and Perception: Attention and perception normal.        Mood and Affect: Mood and affect normal.        Speech: Speech normal.        Behavior: Behavior normal. Behavior is cooperative.        Thought Content: Thought content normal.        Cognition and Memory: Cognition and memory normal.        Judgment: Judgment normal.      Assessment  Plan  Acute cystitis without hematuria - Plan: Urinalysis, Routine w reflex microscopic, Urine Culture, ciprofloxacin (CIPRO) 500 MG tablet bid x 7 days   Left flank pain r/o kidney stones h/o pyelonephritis - Plan: ciprofloxacin (CIPRO) 500 MG tablet   Generalized abdominal pain -  Plan: CT ABDOMEN PELVIS WO CONTRAST  IMPRESSION: 1. Inflammatory changes surrounding the proximal portion of the ascending colon, consistent with colitis. 2. Extensive bowel content is identified throughout colon, consistent with constipation. 3. 1.3 cm nonobstructing stone in the lower pole calyx left kidney. 4. Several cysts in the liver. 5. Aortic atherosclerosis.   Aortic Atherosclerosis (ICD10-I70.0).     Electronically Signed   By: Abelardo Diesel M.D.   On: 08/11/2021 10:06   Yeast vaginitis - Plan: fluconazole (DIFLUCAN) 150 MG tablet   Provider: Dr. Olivia Mackie McLean-Scocuzza-[facility]

## 2021-08-05 NOTE — Patient Instructions (Addendum)
Cipro antibiotic 2x per day with food for 7 days  Align probiotic  Style encore   Dietary Guidelines to Help Prevent Kidney Stones Kidney stones are deposits of minerals and salts that form inside your kidneys. Your risk of developing kidney stones may be greater depending on your diet, your lifestyle, the medicines you take, and whether you have certain medical conditions. Most people can lower their chances of developing kidney stones by following the instructions below. Your dietitian may give you more specific instructions depending on your overall health and the type of kidney stones you tend to develop. What are tips for following this plan? Reading food labels  Choose foods with "no salt added" or "low-salt" labels. Limit your salt (sodium) intake to less than 1,500 mg a day. Choose foods with calcium for each meal and snack. Try to eat about 300 mg of calcium at each meal. Foods that contain 200-500 mg of calcium a serving include: 8 oz (237 mL) of milk, calcium-fortifiednon-dairy milk, and calcium-fortifiedfruit juice. Calcium-fortified means that calcium has been added to these drinks. 8 oz (237 mL) of kefir, yogurt, and soy yogurt. 4 oz (114 g) of tofu. 1 oz (28 g) of cheese. 1 cup (150 g) of dried figs. 1 cup (91 g) of cooked broccoli. One 3 oz (85 g) can of sardines or mackerel. Most people need 1,000-1,500 mg of calcium a day. Talk to your dietitian about how much calcium is recommended for you. Shopping Buy plenty of fresh fruits and vegetables. Most people do not need to avoid fruits and vegetables, even if these foods contain nutrients that may contribute to kidney stones. When shopping for convenience foods, choose: Whole pieces of fruit. Pre-made salads with dressing on the side. Low-fat fruit and yogurt smoothies. Avoid buying frozen meals or prepared deli foods. These can be high in sodium. Look for foods with live cultures, such as yogurt and kefir. Choose high-fiber  grains, such as whole-wheat breads, oat bran, and wheat cereals. Cooking Do not add salt to food when cooking. Place a salt shaker on the table and allow each person to add his or her own salt to taste. Use vegetable protein, such as beans, textured vegetable protein (TVP), or tofu, instead of meat in pasta, casseroles, and soups. Meal planning Eat less salt, if told by your dietitian. To do this: Avoid eating processed or pre-made food. Avoid eating fast food. Eat less animal protein, including cheese, meat, poultry, or fish, if told by your dietitian. To do this: Limit the number of times you have meat, poultry, fish, or cheese each week. Eat a diet free of meat at least 2 days a week. Eat only one serving each day of meat, poultry, fish, or seafood. When you prepare animal protein, cut pieces into small portion sizes. For most meat and fish, one serving is about the size of the palm of your hand. Eat at least five servings of fresh fruits and vegetables each day. To do this: Keep fruits and vegetables on hand for snacks. Eat one piece of fruit or a handful of berries with breakfast. Have a salad and fruit at lunch. Have two kinds of vegetables at dinner. Limit foods that are high in a substance called oxalate. These include: Spinach (cooked), rhubarb, beets, sweet potatoes, and Swiss chard. Peanuts. Potato chips, french fries, and baked potatoes with skin on. Nuts and nut products. Chocolate. If you regularly take a diuretic medicine, make sure to eat at least 1 or 2  servings of fruits or vegetables that are high in potassium each day. These include: Avocado. Banana. Orange, prune, carrot, or tomato juice. Baked potato. Cabbage. Beans and split peas. Lifestyle  Drink enough fluid to keep your urine pale yellow. This is the most important thing you can do. Spread your fluid intake throughout the day. If you drink alcohol: Limit how much you use to: 0-1 drink a day for women who  are not pregnant. 0-2 drinks a day for men. Be aware of how much alcohol is in your drink. In the [place] one drink equals one 12 oz bottle of beer (355 mL), one 5 oz glass of wine (148 mL), or one 1 oz glass of hard liquor (44 mL). Lose weight if told by your health care provider. Work with your dietitian to find an eating plan and weight loss strategies that work best for you. General information Talk to your health care provider and dietitian about taking daily supplements. You may be told the following depending on your health and the cause of your kidney stones: Not to take supplements with vitamin C. To take a calcium supplement. To take a daily probiotic supplement. To take other supplements such as magnesium, fish oil, or vitamin B6. Take over-the-counter and prescription medicines only as told by your health care provider. These include supplements. What foods should I limit? Limit your intake of the following foods, or eat them as told by your dietitian. Vegetables Spinach. Rhubarb. Beets. Canned vegetables. Jenna Davidson. Olives. Baked potatoes with skin. Grains Wheat bran. Baked goods. Salted crackers. Cereals high in sugar. Meats and other proteins Nuts. Nut butters. Large portions of meat, poultry, or fish. Salted, precooked, or cured meats, such as sausages, meat loaves, and hot dogs. Dairy Cheese. Beverages Regular soft drinks. Regular vegetable juice. Seasonings and condiments Seasoning blends with salt. Salad dressings. Soy sauce. Ketchup. Barbecue sauce. Other foods Canned soups. Canned pasta sauce. Casseroles. Pizza. Lasagna. Frozen meals. Potato chips. [place] fries. The items listed above may not be a complete list of foods and beverages you should limit. Contact a dietitian for more information. What foods should I avoid? Talk to your dietitian about specific foods you should avoid based on the type of kidney stones you have and your overall  health. Fruits Grapefruit. The item listed above may not be a complete list of foods and beverages you should avoid. Contact a dietitian for more information. Summary Kidney stones are deposits of minerals and salts that form inside your kidneys. You can lower your risk of kidney stones by making changes to your diet. The most important thing you can do is drink enough fluid. Drink enough fluid to keep your urine pale yellow. Talk to your dietitian about how much calcium you should have each day, and eat less salt and animal protein as told by your dietitian. This information is not intended to replace advice given to you by your health care provider. Make sure you discuss any questions you have with your health care provider. Document Revised: 10/20/2020 Document Reviewed: 10/20/2020 Elsevier Patient Education  [place].  Urinary Tract Infection, Adult  A urinary tract infection (UTI) is an infection of any part of the urinary tract. The urinary tract includes the kidneys, ureters, bladder, and urethra. These organs make, store, and get rid of urine in the body. An upper UTI affects the ureters and kidneys. A lower UTI affects the bladder and urethra. What are the causes? Most urinary tract infections are caused by  bacteria in your genital area around your urethra, where urine leaves your body. These bacteria grow and cause inflammation of your urinary tract. What increases the risk? You are more likely to develop this condition if: You have a urinary catheter that stays in place. You are not able to control when you urinate or have a bowel movement (incontinence). You are female and you: Use a spermicide or diaphragm for birth control. Have low estrogen levels. Are pregnant. You have certain genes that increase your risk. You are sexually active. You take antibiotic medicines. You have a condition that causes your flow of urine to slow down, such as: An enlarged prostate,  if you are female. Blockage in your urethra. A kidney stone. A nerve condition that affects your bladder control (neurogenic bladder). Not getting enough to drink, or not urinating often. You have certain medical conditions, such as: Diabetes. A weak disease-fighting system (immunesystem). Sickle cell disease. Gout. Spinal cord injury. What are the signs or symptoms? Symptoms of this condition include: Needing to urinate right away (urgency). Frequent urination. This may include small amounts of urine each time you urinate. Pain or burning with urination. Blood in the urine. Urine that smells bad or unusual. Trouble urinating. Cloudy urine. Vaginal discharge, if you are female. Pain in the abdomen or the lower back. You may also have: Vomiting or a decreased appetite. Confusion. Irritability or tiredness. A fever or chills. Diarrhea. The first symptom in older adults may be confusion. In some cases, they may not have any symptoms until the infection has worsened. How is this diagnosed? This condition is diagnosed based on your medical history and a physical exam. You may also have other tests, including: Urine tests. Blood tests. Tests for STIs (sexually transmitted infections). If you have had more than one UTI, a cystoscopy or imaging studies may be done to determine the cause of the infections. How is this treated? Treatment for this condition includes: Antibiotic medicine. Over-the-counter medicines to treat discomfort. Drinking enough water to stay hydrated. If you have frequent infections or have other conditions such as a kidney stone, you may need to see a health care provider who specializes in the urinary tract (urologist). In rare cases, urinary tract infections can cause sepsis. Sepsis is a life-threatening condition that occurs when the body responds to an infection. Sepsis is treated in the hospital with IV antibiotics, fluids, and other medicines. Follow these  instructions at home:  Medicines Take over-the-counter and prescription medicines only as told by your health care provider. If you were prescribed an antibiotic medicine, take it as told by your health care provider. Do not stop using the antibiotic even if you start to feel better. General instructions Make sure you: Empty your bladder often and completely. Do not hold urine for long periods of time. Empty your bladder after sex. Wipe from front to back after urinating or having a bowel movement if you are female. Use each tissue only one time when you wipe. Drink enough fluid to keep your urine pale yellow. Keep all follow-up visits. This is important. Contact a health care provider if: Your symptoms do not get better after 1-2 days. Your symptoms go away and then return. Get help right away if: You have severe pain in your back or your lower abdomen. You have a fever or chills. You have nausea or vomiting. Summary A urinary tract infection (UTI) is an infection of any part of the urinary tract, which includes the kidneys, ureters, bladder,  and urethra. Most urinary tract infections are caused by bacteria in your genital area. Treatment for this condition often includes antibiotic medicines. If you were prescribed an antibiotic medicine, take it as told by your health care provider. Do not stop using the antibiotic even if you start to feel better. Keep all follow-up visits. This is important. This information is not intended to replace advice given to you by your health care provider. Make sure you discuss any questions you have with your health care provider. Document Revised: 09/21/2019 Document Reviewed: 09/21/2019 Elsevier Patient Education  [place].  Flank Pain, Adult Flank pain is pain that is located on the side of the body between the upper abdomen and the spine. This area is called the flank. The pain may occur over a short period of time (acute), or it may be  long-term or recurring (chronic). It may be mild or severe. Flank pain can be caused by many things, including: Muscle soreness or injury. Kidney infection, kidney stones, or kidney disease. Stress. A disease of the spine (vertebral disk disease). A lung infection (pneumonia). Fluid around the lungs (pulmonary edema). A skin rash caused by the chickenpox virus (shingles). Tumors that affect the back of the abdomen. Gallbladder disease. Follow these instructions at home:  Drink enough fluid to keep your urine pale yellow. Rest as told by your health care provider. Take over-the-counter and prescription medicines only as told by your health care provider. Keep a journal to track what has caused your flank pain and what has made it feel better. Keep all follow-up visits. This is important. Contact a health care provider if: Your pain is not controlled with medicine. You have new symptoms. Your pain gets worse. Your symptoms last longer than 2-3 days. You have trouble urinating or you are urinating very frequently. Get help right away if: You have trouble breathing or you are short of breath. Your abdomen hurts or it is swollen or red. You have nausea or vomiting. You feel faint, or you faint. You have blood in your urine. You have flank pain and a fever. These symptoms may represent a serious problem that is an emergency. Do not wait to see if the symptoms will go away. Get medical help right away. Call your local emergency services (911 in the [place] Do not drive yourself to the hospital. Summary Flank pain is pain that is located on the side of the body between the upper abdomen and the spine. The pain may occur over a short period of time (acute), or it may be long-term or recurring (chronic). It may be mild or severe. Flank pain can be caused by many things. Contact your health care provider if your symptoms get worse or last longer than 2-3 days. This information is not intended  to replace advice given to you by your health care provider. Make sure you discuss any questions you have with your health care provider. Document Revised: 04/21/2020 Document Reviewed: 04/21/2020 Elsevier Patient Education  [place].

## 2021-08-05 NOTE — Telephone Encounter (Signed)
Lft pt vm to call ofc . thanks 

## 2021-08-06 ENCOUNTER — Other Ambulatory Visit: Payer: Self-pay | Admitting: Internal Medicine

## 2021-08-06 ENCOUNTER — Telehealth: Payer: Self-pay | Admitting: Family Medicine

## 2021-08-06 ENCOUNTER — Encounter: Payer: Self-pay | Admitting: Internal Medicine

## 2021-08-06 DIAGNOSIS — R1084 Generalized abdominal pain: Secondary | ICD-10-CM

## 2021-08-06 LAB — COMPREHENSIVE METABOLIC PANEL
ALT: 12 U/L (ref 0–35)
AST: 14 U/L (ref 0–37)
Albumin: 4.1 g/dL (ref 3.5–5.2)
Alkaline Phosphatase: 67 U/L (ref 39–117)
BUN: 19 mg/dL (ref 6–23)
CO2: 28 mEq/L (ref 19–32)
Calcium: 9.6 mg/dL (ref 8.4–10.5)
Chloride: 101 mEq/L (ref 96–112)
Creatinine, Ser: 0.78 mg/dL (ref 0.40–1.20)
GFR: 80.49 mL/min (ref >60.00)
Glucose, Bld: 89 mg/dL (ref 70–99)
Potassium: 4 mEq/L (ref 3.5–5.1)
Sodium: 138 mEq/L (ref 135–145)
Total Bilirubin: 0.3 mg/dL (ref 0.2–1.2)
Total Protein: 7.2 g/dL (ref 6.0–8.3)

## 2021-08-06 LAB — CBC WITH DIFFERENTIAL/PLATELET
Basophils Absolute: 0.1 10*3/uL (ref 0.0–0.1)
Basophils Relative: 1 % (ref 0.0–3.0)
Eosinophils Absolute: 0.2 10*3/uL (ref 0.0–0.7)
Eosinophils Relative: 3.2 % (ref 0.0–5.0)
HCT: 34.6 % — ABNORMAL LOW (ref 36.0–46.0)
Hemoglobin: 11.4 g/dL — ABNORMAL LOW (ref 12.0–15.0)
Lymphocytes Relative: 26.3 % (ref 12.0–46.0)
Lymphs Abs: 1.7 10*3/uL (ref 0.7–4.0)
MCHC: 33 g/dL (ref 30.0–36.0)
MCV: 93.9 fl (ref 78.0–100.0)
Monocytes Absolute: 0.5 10*3/uL (ref 0.1–1.0)
Monocytes Relative: 7.3 % (ref 3.0–12.0)
Neutro Abs: 3.9 10*3/uL (ref 1.4–7.7)
Neutrophils Relative %: 62.2 % (ref 43.0–77.0)
Platelets: 272 10*3/uL (ref 150.0–400.0)
RBC: 3.69 Mil/uL — ABNORMAL LOW (ref 3.87–5.11)
RDW: 13.6 % (ref 11.5–15.5)
WBC: 6.3 10*3/uL (ref 4.0–10.5)

## 2021-08-06 NOTE — Telephone Encounter (Signed)
Patient returned referrals phone call to schedule CT scan.

## 2021-08-06 NOTE — Telephone Encounter (Signed)
Lft pt vm to call ofc . thanks 

## 2021-08-07 ENCOUNTER — Ambulatory Visit: Payer: BC Managed Care – PPO

## 2021-08-08 ENCOUNTER — Encounter: Payer: Self-pay | Admitting: Internal Medicine

## 2021-08-08 LAB — URINALYSIS, ROUTINE W REFLEX MICROSCOPIC
Bacteria, UA: NONE SEEN /HPF
Bilirubin Urine: NEGATIVE
Glucose, UA: NEGATIVE
Hyaline Cast: NONE SEEN /LPF
Ketones, ur: NEGATIVE
Nitrite: NEGATIVE
Protein, ur: NEGATIVE
RBC / HPF: NONE SEEN /HPF (ref 0–2)
Specific Gravity, Urine: 1.012 (ref 1.001–1.035)
Squamous Epithelial / HPF: NONE SEEN /HPF (ref <=5)
WBC, UA: >=60 /HPF — AB (ref 0–5)
pH: 6.5 (ref 5.0–8.0)

## 2021-08-08 LAB — URINE CULTURE
MICRO NUMBER:: 13524886
SPECIMEN QUALITY:: ADEQUATE

## 2021-08-08 LAB — MICROSCOPIC MESSAGE

## 2021-08-10 ENCOUNTER — Telehealth: Payer: Self-pay

## 2021-08-10 NOTE — Telephone Encounter (Signed)
LMTCB to go over lab result.

## 2021-08-11 ENCOUNTER — Ambulatory Visit
Admission: RE | Admit: 2021-08-11 | Discharge: 2021-08-11 | Disposition: A | Discharge status: Home / Self Care | Payer: BC Managed Care – PPO | Source: Ambulatory Visit | Attending: Internal Medicine | Admitting: Internal Medicine

## 2021-08-11 ENCOUNTER — Other Ambulatory Visit: Payer: Self-pay | Admitting: Internal Medicine

## 2021-08-11 ENCOUNTER — Encounter: Payer: Self-pay | Admitting: Internal Medicine

## 2021-08-11 DIAGNOSIS — R1084 Generalized abdominal pain: Secondary | ICD-10-CM | POA: Diagnosis not present

## 2021-08-11 DIAGNOSIS — I700 Atherosclerosis of aorta: Secondary | ICD-10-CM | POA: Insufficient documentation

## 2021-08-11 DIAGNOSIS — M47816 Spondylosis without myelopathy or radiculopathy, lumbar region: Secondary | ICD-10-CM | POA: Insufficient documentation

## 2021-08-11 DIAGNOSIS — K529 Noninfective gastroenteritis and colitis, unspecified: Secondary | ICD-10-CM | POA: Insufficient documentation

## 2021-08-11 DIAGNOSIS — N200 Calculus of kidney: Secondary | ICD-10-CM | POA: Diagnosis not present

## 2021-08-11 DIAGNOSIS — K5900 Constipation, unspecified: Secondary | ICD-10-CM | POA: Insufficient documentation

## 2021-08-11 DIAGNOSIS — K7689 Other specified diseases of liver: Secondary | ICD-10-CM | POA: Diagnosis not present

## 2021-08-11 MED ORDER — METRONIDAZOLE 500 MG PO TABS: 500.0000 mg | ORAL_TABLET | Freq: Two times a day (BID) | ORAL | 0 refills | Status: DC

## 2021-08-11 NOTE — Addendum Note (Signed)
Addended by: Orland Mustard on: 08/11/2021 10:24 AM   Modules accepted: Orders

## 2021-08-14 ENCOUNTER — Encounter: Payer: Self-pay | Admitting: *Deleted

## 2021-09-02 ENCOUNTER — Ambulatory Visit: Payer: BC Managed Care – PPO | Admitting: Internal Medicine

## 2021-09-02 ENCOUNTER — Ambulatory Visit: Payer: BC Managed Care – PPO | Admitting: Family

## 2021-10-14 ENCOUNTER — Ambulatory Visit (INDEPENDENT_AMBULATORY_CARE_PROVIDER_SITE_OTHER): Payer: BC Managed Care – PPO | Admitting: Family Medicine

## 2021-10-14 ENCOUNTER — Encounter: Payer: Self-pay | Admitting: Family Medicine

## 2021-10-14 VITALS — BP 120/80 | HR 75 | Temp 98.4°F | Ht 66.5 in | Wt 131.2 lb

## 2021-10-14 DIAGNOSIS — Z23 Encounter for immunization: Secondary | ICD-10-CM | POA: Diagnosis not present

## 2021-10-14 DIAGNOSIS — E785 Hyperlipidemia, unspecified: Secondary | ICD-10-CM | POA: Diagnosis not present

## 2021-10-14 DIAGNOSIS — Z1231 Encounter for screening mammogram for malignant neoplasm of breast: Secondary | ICD-10-CM

## 2021-10-14 DIAGNOSIS — K529 Noninfective gastroenteritis and colitis, unspecified: Secondary | ICD-10-CM

## 2021-10-14 DIAGNOSIS — K5900 Constipation, unspecified: Secondary | ICD-10-CM

## 2021-10-14 DIAGNOSIS — N200 Calculus of kidney: Secondary | ICD-10-CM | POA: Diagnosis not present

## 2021-10-14 DIAGNOSIS — K7689 Other specified diseases of liver: Secondary | ICD-10-CM | POA: Diagnosis not present

## 2021-10-14 DIAGNOSIS — I700 Atherosclerosis of aorta: Secondary | ICD-10-CM

## 2021-10-14 NOTE — Assessment & Plan Note (Signed)
Symptoms have resolved.  She needs to have a colonoscopy.  She will keep her appointment with GI.

## 2021-10-14 NOTE — Assessment & Plan Note (Signed)
Patient will see urology.  Discussed it is pretty unlikely that she will pass this stone given the size.  Discussed she may not have an issue with it in the future.

## 2021-10-14 NOTE — Assessment & Plan Note (Addendum)
LDL has been elevated though her ASCVD risk score has been low.  She does report a strong family history of cardiovascular disease.  She has aortic atherosclerosis on her CT scan.  Discussed potentially getting coronary calcium scoring to see if that would help guide potential for statin management.  Given her low risk status the coronary CT scan would not be recommended. This will be relayed to the patient.

## 2021-10-14 NOTE — Progress Notes (Signed)
Tommi Rumps, MD Phone: [phone]  Jenna Davidson is a 66 y.o. female who presents today for follow-up.  Colitis: Patient previously saw one of the other providers in the office and was diagnosed with colitis.  She was treated with ciprofloxacin and Flagyl.  She notes the symptoms have improved significantly.  She was also diagnosed with constipation on her CT scan.  She has been taking a daily probiotic and has been having daily bowel movements.  She still says some occasional bloating.  She is scheduled to see GI in September.  She felt as though she had a bulge in her left lower quadrant that has been going on intermittently for some time.  At times it bulges out and other times it is normal.  She was noted to have aortic atherosclerosis on her CT scan.  She was also noted to have a fairly large kidney stone.  She needs to call urology back to schedule an appointment.  She does report a strong family history of cardiovascular disease.  The 10-year ASCVD risk score (Arnett DK, et al., 2019) is: 4.3%   Values used to calculate the score:     Age: 56 years     Sex: Female     Is Non-Hispanic African American: No     Diabetic: No     Tobacco smoker: No     Systolic Blood Pressure: 119 mmHg     Is BP treated: No     HDL Cholesterol: 66.9 mg/dL     Total Cholesterol: 224 mg/dL   Social History   Tobacco Use  Smoking Status Never  Smokeless Tobacco Never    Current Outpatient Medications on File Prior to Visit  Medication Sig Dispense Refill   Acetaminophen (TYLENOL PO) Take by mouth as needed.     chlorpheniramine (CHLOR-TRIMETON) 4 MG tablet Take 4 mg by mouth as needed for allergies.     ciprofloxacin (CIPRO) 500 MG tablet Take 1 tablet (500 mg total) by mouth 2 (two) times daily. With food x 7 days 14 tablet 0   cyclobenzaprine (FLEXERIL) 5 MG tablet Take 1 tablet (5 mg total) by mouth 3 (three) times daily as needed for muscle spasms. 30 tablet 0   Ibuprofen-Diphenhydramine  Cit (IBUPROFEN PM PO) Take by mouth as needed.     levothyroxine (SYNTHROID) 125 MCG tablet Take 1 tablet (125 mcg total) by mouth daily before breakfast. 90 tablet 3   LORazepam (ATIVAN) 0.5 MG tablet Take 1 tablet (0.5 mg total) by mouth as needed for anxiety. 30 tablet 0   Magnesium 500 MG CAPS Take by mouth.     metroNIDAZOLE (FLAGYL) 500 MG tablet Take 1 tablet (500 mg total) by mouth 2 (two) times daily. With food and completion of cipro 14 tablet 0   Naproxen Sodium (ALEVE PO) Take by mouth.     promethazine (PHENERGAN) 25 MG tablet Take 1 tablet (25 mg total) by mouth every 8 (eight) hours as needed for nausea. 30 tablet 0   rizatriptan (MAXALT) 5 MG tablet May repeat in 2 hours if needed 10 tablet 7   No current facility-administered medications on file prior to visit.     ROS see history of present illness  Objective  Physical Exam Vitals:   10/14/21 1615  BP: 120/80  Pulse: 75  Temp: 98.4 F (36.9 C)  SpO2: 98%    BP Readings from Last 3 Encounters:  10/14/21 120/80  08/05/21 130/80  12/03/20 132/86   Wt Readings from  Last 3 Encounters:  10/14/21 131 lb 3.2 oz (59.5 kg)  08/05/21 135 lb (61.2 kg)  12/03/20 131 lb (59.4 kg)    Physical Exam Constitutional:      General: She is not in acute distress.    Appearance: She is not diaphoretic.  Pulmonary:     Effort: Pulmonary effort is normal.  Abdominal:     General: Bowel sounds are normal. There is no distension.     Palpations: Abdomen is soft.     Tenderness: There is no abdominal tenderness.  Skin:    General: Skin is warm and dry.  Neurological:     Mental Status: She is alert.      Assessment/Plan: Please see individual problem list.  Problem List Items Addressed This Visit     Aortic atherosclerosis ([place]) (Chronic)    Discussed risk factor management.      Hyperlipidemia (Chronic)    LDL has been elevated though her ASCVD risk score has been low.  She does report a strong family history  of cardiovascular disease.  She has aortic atherosclerosis on her CT scan.  Discussed potentially getting coronary calcium scoring to see if that would help guide potential for statin management.  We will see if her insurance will cover this.      Colitis - Primary    Symptoms have resolved.  She needs to have a colonoscopy.  She will keep her appointment with GI.      Constipation    Resolved.  She will monitor for recurrence.  No findings of a hernia or lymphadenopathy or other cause on CT for the occasional bulge she gets in her left lower quadrant.      Kidney stone on left side    Patient will see urology.  Discussed it is pretty unlikely that she will pass this stone given the size.  Discussed she may not have an issue with it in the future.      Liver cyst    Patient reports a history of these in the past.  Discussed generally these are benign.  She will see GI and get their input as well.      Other Visit Diagnoses     Need for immunization against influenza       Relevant Orders   Flu Vaccine QUAD 8moIM (Fluarix, Fluzone & Alfiuria Quad PF) (Completed)   Encounter for screening mammogram for malignant neoplasm of breast       Relevant Orders   MM 3D SCREEN BREAST BILATERAL        Health Maintenance: Patient will call to schedule her mammogram.  Return for As scheduled.   ETommi Rumps MD [facility]

## 2021-10-14 NOTE — Assessment & Plan Note (Signed)
Discussed risk factor management.

## 2021-10-14 NOTE — Assessment & Plan Note (Signed)
Patient reports a history of these in the past.  Discussed generally these are benign.  She will see GI and get their input as well.

## 2021-10-14 NOTE — Patient Instructions (Signed)
Please call [phone] to schedule your mammogram.

## 2021-10-14 NOTE — Assessment & Plan Note (Addendum)
Resolved.  She will monitor for recurrence.  No findings of a hernia or lymphadenopathy or other cause on CT for the occasional bulge she gets in her left lower quadrant.

## 2021-10-22 ENCOUNTER — Telehealth: Payer: Self-pay | Admitting: Family Medicine

## 2021-10-22 NOTE — Telephone Encounter (Signed)
Please let the patient know that after her labs came back I was able to run a risk calculation for her cardiovascular risk and she is currently low risk and thus does not meet criteria for a coronary CT scan.

## 2021-10-23 NOTE — Telephone Encounter (Signed)
LVM for patient to call back.   Cartina Brousseau,cma  

## 2021-10-27 NOTE — Telephone Encounter (Signed)
Lvm for pt to return call to inform that she does not meet criteria for coronary CT due to low cardiovascular risk.

## 2021-10-29 NOTE — Telephone Encounter (Signed)
Called pt x2 lvm x2 for pt to return call to inform that she does not meet criteria for coronary CT due to low cardiovascular risk.

## 2021-10-30 NOTE — Telephone Encounter (Signed)
LMTCB to let her know that she does not meet the criteria for a coronary CT due to low cardiovascular risk.

## 2021-11-03 NOTE — Telephone Encounter (Signed)
Information sent to patient via mychart after 3 attempts to reach  by phone.  Naji Mehringer,cma

## 2021-11-19 DIAGNOSIS — K5909 Other constipation: Secondary | ICD-10-CM | POA: Diagnosis not present

## 2021-11-19 DIAGNOSIS — K4090 Unilateral inguinal hernia, without obstruction or gangrene, not specified as recurrent: Secondary | ICD-10-CM | POA: Diagnosis not present

## 2021-11-19 DIAGNOSIS — Z8719 Personal history of other diseases of the digestive system: Secondary | ICD-10-CM | POA: Diagnosis not present

## 2021-11-19 DIAGNOSIS — Z1211 Encounter for screening for malignant neoplasm of colon: Secondary | ICD-10-CM | POA: Diagnosis not present

## 2021-11-23 DIAGNOSIS — K4090 Unilateral inguinal hernia, without obstruction or gangrene, not specified as recurrent: Secondary | ICD-10-CM | POA: Diagnosis not present

## 2021-12-04 ENCOUNTER — Ambulatory Visit (INDEPENDENT_AMBULATORY_CARE_PROVIDER_SITE_OTHER): Payer: BC Managed Care – PPO | Admitting: Family Medicine

## 2021-12-04 ENCOUNTER — Encounter: Payer: Self-pay | Admitting: Family Medicine

## 2021-12-04 VITALS — BP 128/80 | HR 70 | Temp 98.0°F | Ht 66.0 in | Wt 135.4 lb

## 2021-12-04 DIAGNOSIS — E890 Postprocedural hypothyroidism: Secondary | ICD-10-CM

## 2021-12-04 DIAGNOSIS — M62838 Other muscle spasm: Secondary | ICD-10-CM | POA: Diagnosis not present

## 2021-12-04 DIAGNOSIS — Z0000 Encounter for general adult medical examination without abnormal findings: Secondary | ICD-10-CM

## 2021-12-04 DIAGNOSIS — E785 Hyperlipidemia, unspecified: Secondary | ICD-10-CM | POA: Diagnosis not present

## 2021-12-04 DIAGNOSIS — I700 Atherosclerosis of aorta: Secondary | ICD-10-CM

## 2021-12-04 DIAGNOSIS — F419 Anxiety disorder, unspecified: Secondary | ICD-10-CM | POA: Diagnosis not present

## 2021-12-04 DIAGNOSIS — Z130 Encounter for screening for diseases of the blood and blood-forming organs and certain disorders involving the immune mechanism: Secondary | ICD-10-CM | POA: Diagnosis not present

## 2021-12-04 DIAGNOSIS — G43909 Migraine, unspecified, not intractable, without status migrainosus: Secondary | ICD-10-CM

## 2021-12-04 LAB — LIPID PANEL
Cholesterol: 196 mg/dL (ref 0–200)
HDL: 57 mg/dL (ref >39.00)
LDL Cholesterol: 121 mg/dL — ABNORMAL HIGH (ref 0–99)
NonHDL: 139.46
Total CHOL/HDL Ratio: 3
Triglycerides: 90 mg/dL (ref 0.0–149.0)
VLDL: 18 mg/dL (ref 0.0–40.0)

## 2021-12-04 LAB — COMPREHENSIVE METABOLIC PANEL
ALT: 11 U/L (ref 0–35)
AST: 14 U/L (ref 0–37)
Albumin: 4.1 g/dL (ref 3.5–5.2)
Alkaline Phosphatase: 54 U/L (ref 39–117)
BUN: 17 mg/dL (ref 6–23)
CO2: 30 mEq/L (ref 19–32)
Calcium: 9.5 mg/dL (ref 8.4–10.5)
Chloride: 105 mEq/L (ref 96–112)
Creatinine, Ser: 0.75 mg/dL (ref 0.40–1.20)
GFR: 84.17 mL/min (ref >60.00)
Glucose, Bld: 94 mg/dL (ref 70–99)
Potassium: 4.5 mEq/L (ref 3.5–5.1)
Sodium: 140 mEq/L (ref 135–145)
Total Bilirubin: 0.4 mg/dL (ref 0.2–1.2)
Total Protein: 7.1 g/dL (ref 6.0–8.3)

## 2021-12-04 LAB — CBC
HCT: 36.3 % (ref 36.0–46.0)
Hemoglobin: 11.9 g/dL — ABNORMAL LOW (ref 12.0–15.0)
MCHC: 32.8 g/dL (ref 30.0–36.0)
MCV: 93.4 fl (ref 78.0–100.0)
Platelets: 233 10*3/uL (ref 150.0–400.0)
RBC: 3.89 Mil/uL (ref 3.87–5.11)
RDW: 14.5 % (ref 11.5–15.5)
WBC: 4.2 10*3/uL (ref 4.0–10.5)

## 2021-12-04 LAB — TSH: TSH: 0.22 u[IU]/mL — ABNORMAL LOW (ref 0.35–5.50)

## 2021-12-04 MED ORDER — RIZATRIPTAN BENZOATE 5 MG PO TABS: ORAL_TABLET | ORAL | 7 refills | Status: AC

## 2021-12-04 MED ORDER — LEVOTHYROXINE SODIUM 125 MCG PO TABS: 125.0000 ug | ORAL_TABLET | Freq: Every day | ORAL | 3 refills | Status: AC

## 2021-12-04 MED ORDER — CYCLOBENZAPRINE HCL 5 MG PO TABS: 5.0000 mg | ORAL_TABLET | Freq: Three times a day (TID) | ORAL | 0 refills | Status: AC | PRN

## 2021-12-04 MED ORDER — LORAZEPAM 0.5 MG PO TABS: 0.5000 mg | ORAL_TABLET | ORAL | 0 refills | Status: AC | PRN

## 2021-12-04 MED ORDER — PROMETHAZINE HCL 25 MG PO TABS: 25.0000 mg | ORAL_TABLET | Freq: Three times a day (TID) | ORAL | 0 refills | Status: AC | PRN

## 2021-12-04 NOTE — Assessment & Plan Note (Signed)
Physical exam pleated.  Encouraged continued healthy diet and exercise.  She will call to schedule her mammogram.  She reports colonoscopy is scheduled.  Pap smear is up-to-date.  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  Discussed Pap smear not indicated at this time.  She declines further COVID vaccinations.  Lab work as outlined.  Discussed if she developed postmenopausal bleeding she needs to let us know right away.

## 2021-12-04 NOTE — Patient Instructions (Signed)
Nice to see you. Please continue to work on diet and exercise. Please call to schedule your mammogram.

## 2021-12-04 NOTE — Progress Notes (Signed)
Tommi Rumps, MD Phone: [phone]  Jenna Davidson is a 66 y.o. female who presents today for CPE.  Diet: plenty of fruits and vegetables, minimal fried foods, some sweets though not daily, no soda or sweet tea Exercise: walks a lot Pap smear: 12/03/20 NILM neg HPV Colonoscopy: due, reports scheduled Mammogram: due Family history-  Colon cancer: no  Breast cancer: no  Ovarian cancer: no Menses: postmenopausal Vaccines-   Flu: UTD  Tetanus: UTD  Shingles: UTD  COVID19: x3 HIV screening: donated blood in the past Hep C Screening: UTD Tobacco use: no Alcohol use: 2-3/JSE[facility] Illicit Drug use: no Dentist: yes Ophthalmology: yes   Active Ambulatory Problems    Diagnosis Date Noted   Postoperative hypothyroidism 07/31/2015   Hyperlipidemia 07/31/2015   Anxiety 07/31/2015   Routine general medical examination at a health care facility 11/10/2016   Migraine 11/11/2017   Allergic rhinitis 12/06/2019   Muscle spasm 12/03/2020   Colitis 08/11/2021   Aortic atherosclerosis ([place]) 08/11/2021   Constipation 08/11/2021   Liver cyst 08/11/2021   Kidney stone on left side 08/11/2021   Arthritis, 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   Resolved Ambulatory Problems    Diagnosis Date Noted   Encounter for preventative adult health care exam with abnormal findings 07/31/2015   Bilateral hand pain 07/31/2015   Post-nasal drip 11/05/2015   Vaginal discharge 07/29/2016   LGSIL on Pap smear of cervix 11/11/2017   Ear pain 01/22/2020   Dysuria 12/03/2020   Past Medical History:  Diagnosis Date   Allergy    Cancer ([place]) 1983   Chicken pox    COVID-19    Heart murmur    Hypothyroidism    Kidney stones    Migraines     Family History  Problem Relation Age of Onset   Alcohol abuse Father    Lung cancer Father    Heart disease Father    Diabetes Maternal Grandmother    Diabetes Maternal Grandfather    Uterine cancer Paternal Grandmother    Heart disease Paternal Grandmother    Heart  disease Paternal Grandfather    Stroke Paternal Grandfather     Social History   Socioeconomic History   Marital status: Widowed    Spouse name: Not on file   Number of children: Not on file   Years of education: Not on file   Highest education level: Not on file  Occupational History   Not on file  Tobacco Use   Smoking status: Never   Smokeless tobacco: Never  Vaping Use   Vaping Use: Never used  Substance and Sexual Activity   Alcohol use: Yes    Alcohol/week: 0.0 - 1.0 standard drinks of alcohol   Drug use: No   Sexual activity: Yes    Birth control/protection: Post-menopausal  Other Topics Concern   Not on file  Social History Narrative   Not on file   Social Determinants of Health   Financial Resource Strain: Not on file  Food Insecurity: Not on file  Transportation Needs: Not on file  Physical Activity: Not on file  Stress: Not on file  Social Connections: Not on file  Intimate Partner Violence: Not on file    ROS  General:  Negative for nexplained weight loss, fever Skin: Negative for new or changing mole, sore that won't heal HEENT: Negative for trouble hearing, trouble seeing, ringing in ears, mouth sores, hoarseness, change in voice, dysphagia. CV:  Negative for chest pain, dyspnea, edema, palpitations Resp: Negative for cough, dyspnea, hemoptysis  GI: Negative for nausea, vomiting, diarrhea, constipation, abdominal pain, melena, hematochezia. GU: Negative for dysuria, incontinence, urinary hesitance, hematuria, vaginal or penile discharge, polyuria, sexual difficulty, lumps in testicle or breasts MSK: Negative for muscle cramps or aches, joint pain or swelling Neuro: Negative for headaches, weakness, numbness, dizziness, passing out/fainting Psych: Negative for depression, anxiety, memory problems  Objective  Physical Exam Vitals:   12/04/21 0806  BP: 128/80  Pulse: 70  Temp: 98 F (36.7 C)  SpO2: 98%    BP Readings from Last 3 Encounters:   12/04/21 128/80  10/14/21 120/80  08/05/21 130/80   Wt Readings from Last 3 Encounters:  12/04/21 135 lb 6.4 oz (61.4 kg)  10/14/21 131 lb 3.2 oz (59.5 kg)  08/05/21 135 lb (61.2 kg)    Physical Exam Constitutional:      General: She is not in acute distress.    Appearance: She is not diaphoretic.  HENT:     Head: Normocephalic and atraumatic.  Cardiovascular:     Rate and Rhythm: Normal rate and regular rhythm.     Heart sounds: Normal heart sounds.  Pulmonary:     Effort: Pulmonary effort is normal.     Breath sounds: Normal breath sounds.  Abdominal:     General: Bowel sounds are normal. There is no distension.     Palpations: Abdomen is soft.     Tenderness: There is no abdominal tenderness.  Musculoskeletal:     Right lower leg: No edema.     Left lower leg: No edema.  Lymphadenopathy:     Cervical: No cervical adenopathy.  Skin:    General: Skin is warm and dry.  Neurological:     Mental Status: She is alert.  Psychiatric:        Mood and Affect: Mood normal.      Assessment/Plan:   Problem List Items Addressed This Visit     Aortic atherosclerosis (HCC) (Chronic)   Relevant Orders   Lipid panel   Hyperlipidemia (Chronic)   Relevant Orders   Comp Met (CMET)   Lipid panel   Anxiety   Relevant Medications   LORazepam (ATIVAN) 0.5 MG tablet   Migraine   Relevant Medications   cyclobenzaprine (FLEXERIL) 5 MG tablet   promethazine (PHENERGAN) 25 MG tablet   rizatriptan (MAXALT) 5 MG tablet   Muscle spasm   Relevant Medications   cyclobenzaprine (FLEXERIL) 5 MG tablet   Postoperative hypothyroidism   Relevant Medications   levothyroxine (SYNTHROID) 125 MCG tablet   Other Relevant Orders   TSH   Routine general medical examination at a health care facility - Primary    Physical exam pleated.  Encouraged continued healthy diet and exercise.  She will call to schedule her mammogram.  She reports colonoscopy is scheduled.  Pap smear is up-to-date.   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  Discussed Pap smear not indicated at this time.  She declines further COVID vaccinations.  Lab work as outlined.  Discussed if she developed postmenopausal bleeding she needs to let us know right away.      Other Visit Diagnoses     Screening for deficiency anemia       Relevant Orders   CBC       Return in about 1 year (around 12/05/2022) for CPE.   Tommi Rumps, MD [place]

## 2021-12-31 DIAGNOSIS — Z1211 Encounter for screening for malignant neoplasm of colon: Secondary | ICD-10-CM | POA: Diagnosis not present

## 2021-12-31 DIAGNOSIS — K641 Second degree hemorrhoids: Secondary | ICD-10-CM | POA: Diagnosis not present

## 2022-01-26 ENCOUNTER — Ambulatory Visit
Admission: RE | Admit: 2022-01-26 | Discharge: 2022-01-26 | Disposition: A | Discharge status: Home / Self Care | Payer: BC Managed Care – PPO | Source: Ambulatory Visit | Attending: Urgent Care | Admitting: Urgent Care

## 2022-01-26 VITALS — BP 137/82 | HR 71 | Temp 98.2°F | Resp 16

## 2022-01-26 DIAGNOSIS — J019 Acute sinusitis, unspecified: Secondary | ICD-10-CM | POA: Diagnosis not present

## 2022-01-26 DIAGNOSIS — B9689 Other specified bacterial agents as the cause of diseases classified elsewhere: Secondary | ICD-10-CM

## 2022-01-26 MED ORDER — AMOXICILLIN-POT CLAVULANATE 875-125 MG PO TABS: 1.0000 | ORAL_TABLET | Freq: Two times a day (BID) | ORAL | 0 refills | Status: AC

## 2022-01-26 NOTE — Discharge Instructions (Signed)
Follow up here or with your primary care provider if your symptoms are worsening or not improving with treatment.     

## 2022-01-26 NOTE — ED Provider Notes (Signed)
Jenna Davidson    CSN: [id number] Arrival date & time: 01/26/22  1018      History   Chief Complaint Chief Complaint  Patient presents with   Nasal Congestion    ear stuffiness - Symptoms for more than 3 weeks - Entered by patient    HPI Jenna Davidson is a 66 y.o. female.   HPI  Presents to urgent care with complaint of "tight/full" feeling in her left nasal cavity.  Patient states she had a colonoscopy on November 9 (3 weeks ago) and during the procedure she had cannula in her nose/nostril with feeling of tightness since.  She endorses yellow/green drainage from the lower left side of her nostril.  She endorses pressure in her ear with loss of hearing on the left side.  She endorses sometimes cough.  Symptoms x 3 weeks.  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 left breast sits higher than the right.    BREAST ENHANCEMENT SURGERY  2006   breast augmentation    TONSILLECTOMY AND ADENOIDECTOMY  1967    OB History     Gravida  1   Para  1   Term  1   Preterm      AB      Living  1      SAB      IAB      Ectopic       Multiple      Live Births  1            Home Medications    Prior to Admission medications   Medication Sig Start Date End Date Taking? Authorizing Provider  Acetaminophen (TYLENOL PO) Take by mouth as needed.    [provider]  chlorpheniramine (CHLOR-TRIMETON) 4 MG tablet Take 4 mg by mouth as needed for allergies.    [provider]  cyclobenzaprine (FLEXERIL) 5 MG tablet Take 1 tablet (5 mg total) by mouth 3 (three) times daily as needed for muscle spasms. 12/04/21   Leone Haven, MD  Ibuprofen-Diphenhydramine Cit (IBUPROFEN PM PO) Take by mouth as needed.    [provider]  levothyroxine (SYNTHROID) 125 MCG tablet Take 1 tablet (125 mcg total) by mouth daily before breakfast. 12/04/21   Leone Haven, MD  LORazepam (ATIVAN) 0.5 MG tablet Take 1 tablet (0.5 mg total) by mouth as needed for anxiety. 12/04/21   Leone Haven, MD  Magnesium 500 MG CAPS Take by mouth.    [provider]  Naproxen Sodium (ALEVE PO) Take by mouth.  [provider]  promethazine (PHENERGAN) 25 MG tablet Take 1 tablet (25 mg total) by mouth every 8 (eight) hours as needed for nausea. 12/04/21   Leone Haven, MD  rizatriptan (MAXALT) 5 MG tablet May repeat in 2 hours if needed 12/04/21   Leone Haven, MD    Family History Family History  Problem Relation Age of Onset   Alcohol abuse Father    Lung cancer Father    Heart disease Father    Diabetes Maternal Grandmother    Diabetes Maternal Grandfather    Uterine cancer Paternal Grandmother    Heart disease Paternal Grandmother    Heart disease Paternal Grandfather    Stroke Paternal Grandfather     Social History Social History   Tobacco Use   Smoking status: Never   Smokeless tobacco: Never  Vaping Use   Vaping Use: Never used  Substance Use Topics   Alcohol use: Yes    Alcohol/week: 0.0 - 1.0 standard drinks of alcohol   Drug use: No     Allergies    Patient has no known allergies.   Review of Systems Review of Systems   Physical Exam Triage Vital Signs ED Triage Vitals  Enc Vitals Group     BP 01/26/22 1051 137/82     Pulse Rate 01/26/22 1051 71     Resp 01/26/22 1051 16     Temp 01/26/22 1051 98.2 F (36.8 C)     Temp Source 01/26/22 1051 Oral     SpO2 01/26/22 1051 98 %     Weight --      Height --      Head Circumference --      Peak Flow --      Pain Score 01/26/22 1052 0     Pain Loc --      Pain Edu? --      Excl. in [place]? --    No data found.  Updated Vital Signs BP 137/82 (BP Location: Left Arm)   Pulse 71   Temp 98.2 F (36.8 C) (Oral)   Resp 16   SpO2 98%   Visual Acuity Right Eye Distance:   Left Eye Distance:   Bilateral Distance:    Right Eye Near:   Left Eye Near:    Bilateral Near:     Physical Exam Vitals reviewed.  Constitutional:      Appearance: Normal appearance. She is not ill-appearing.  HENT:     Nose: Mucosal edema present.     Left Turbinates: Swollen.  Skin:    General: Skin is warm and dry.  Neurological:     General: No focal deficit present.     Mental Status: She is alert and oriented to person, place, and time.  Psychiatric:        Mood and Affect: Mood normal.        Behavior: Behavior normal.      UC Treatments / Results  Labs (all labs ordered are listed, but only abnormal results are displayed) Labs Reviewed - No data to display  EKG   Radiology No results found.  Procedures Procedures (including critical care time)  Medications Ordered in UC Medications - No data to display  Initial Impression / Assessment and Plan / UC Course  I have reviewed the triage vital signs and the nursing notes.  Pertinent labs & imaging results that were available during my care of the patient were reviewed by me and considered in my medical decision  making (see chart for details).   Suspect acute bacterial sinusitis given duration of symptoms greater than 3  weeks.  Unclear if her symptoms are secondary to the procedure that she describes or a past viral URI.  Will treat with Augmentin x 7 days.  Final Clinical Impressions(s) / UC Diagnoses   Final diagnoses:  None   Discharge Instructions   None    ED Prescriptions   None    PDMP not reviewed this encounter.   Rose Phi, [place] 01/26/22 1119

## 2022-01-26 NOTE — ED Triage Notes (Signed)
Pt. Presents to UC w/ c/o a "tight/full" feeling in her left nasal cavity. Pt. States she had a colonoscopy done Nov. 9th and during the procedure they placed O2 in her nose and ever since then her left nasal passage has been feeling tight and full.

## 2022-03-20 DIAGNOSIS — H02849 Edema of unspecified eye, unspecified eyelid: Secondary | ICD-10-CM | POA: Diagnosis not present

## 2022-12-08 ENCOUNTER — Encounter: Payer: Self-pay | Admitting: Family Medicine

## 2022-12-24 ENCOUNTER — Other Ambulatory Visit: Payer: Self-pay | Admitting: Family Medicine

## 2022-12-24 DIAGNOSIS — E890 Postprocedural hypothyroidism: Secondary | ICD-10-CM

## 2022-12-24 NOTE — Telephone Encounter (Signed)
LMTCB

## 2023-05-04 ENCOUNTER — Telehealth: Payer: Self-pay | Admitting: Family Medicine

## 2023-05-04 NOTE — Telephone Encounter (Signed)
 Dr Birdie Sons is no longer at this location. Please call the office to schedule a Transfer of Care to either Dr Charlann Lange, Darleen Crocker or Kara Dies, NP. [id number] please schedule pt.   Thank you

## 2023-09-17 IMAGING — CT CT ABD-PELV W/O CM
2 of 4 series · 16 of 46 positions shown, 18 images · non-contrast
Comparison: None Available.

CLINICAL DATA: Left flank pain for 2 weeks.



[Series 2: axials routine abdomen pelvis without 5.00 · axial · non-contrast · 0.67mm/px · z∈[-1530,-1160]mm · 13 of 82 slices shown, 15 images]
[im 4/82  soft-tissue]
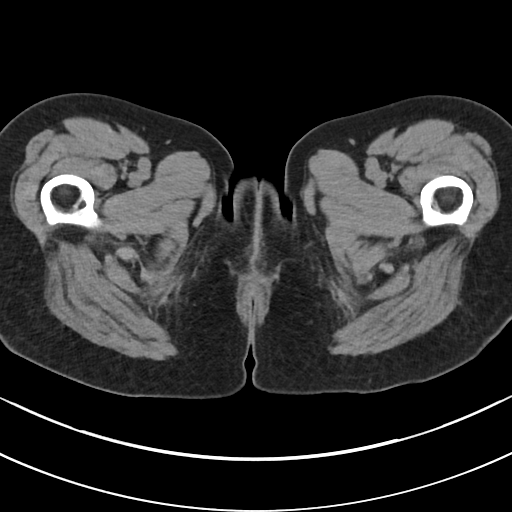
[im 4/82  bone]
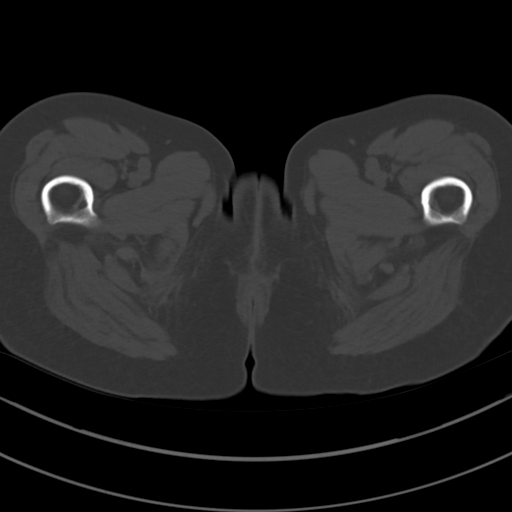
[im 10/82  soft-tissue]
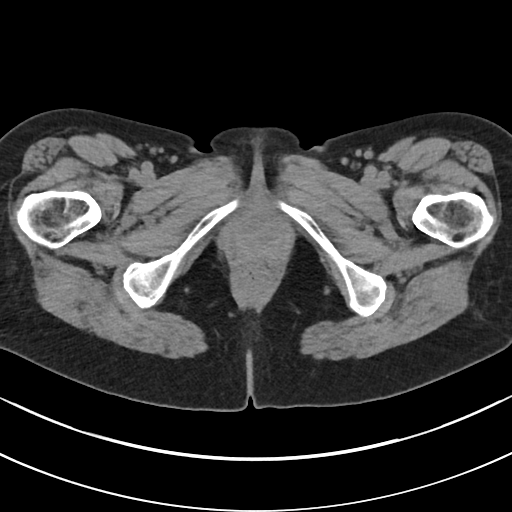
[im 16/82  soft-tissue]
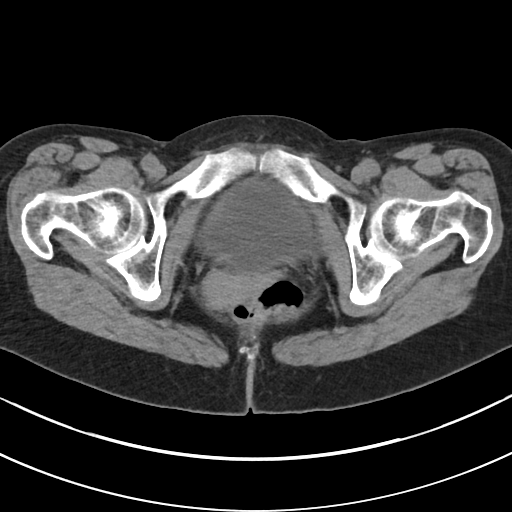
[im 22/82  soft-tissue]
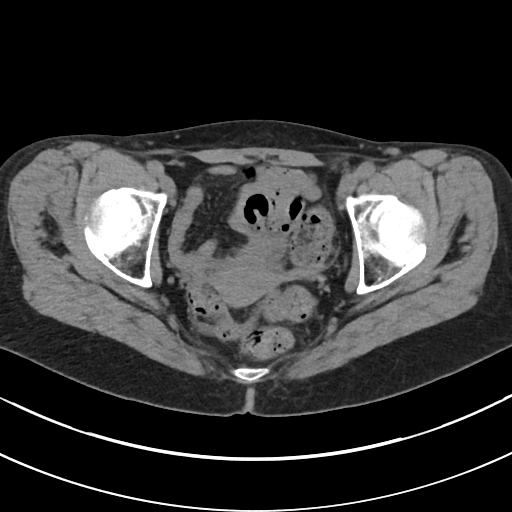
[im 29/82  soft-tissue]
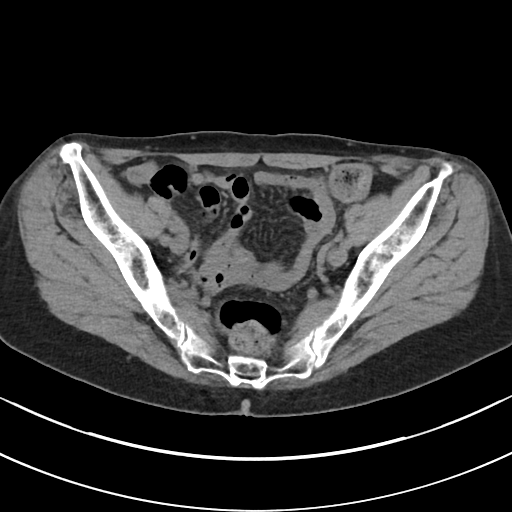
[im 35/82  soft-tissue]
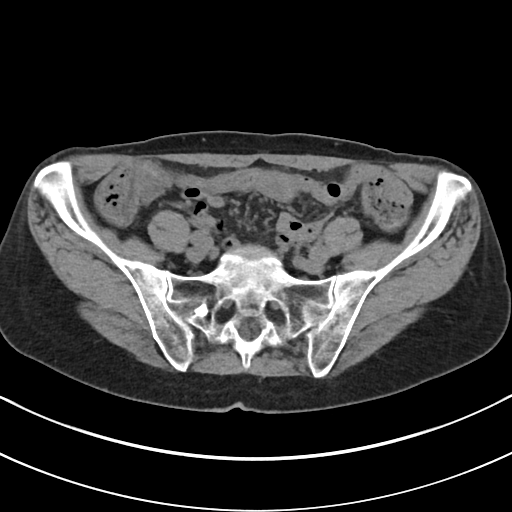
[im 41/82  soft-tissue]
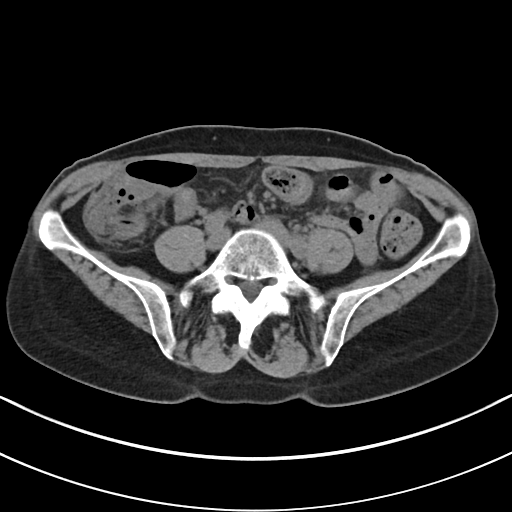
[im 47/82  soft-tissue]
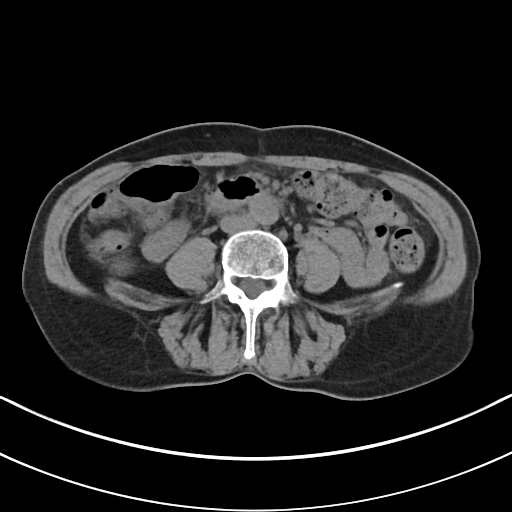
[im 53/82  soft-tissue]
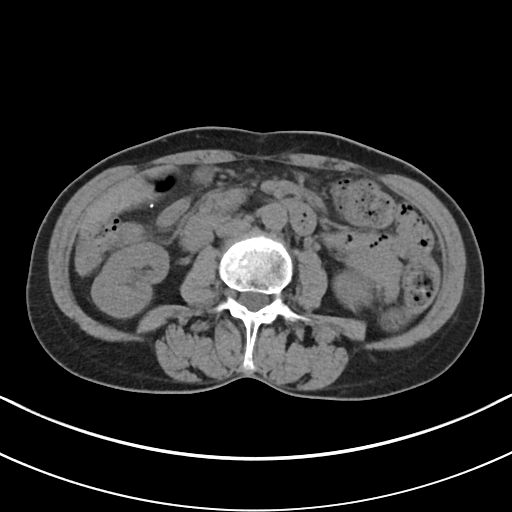
[im 53/82  bone]
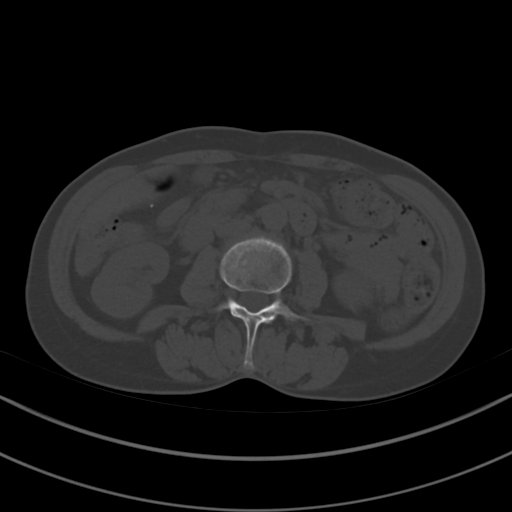
[im 60/82  soft-tissue]
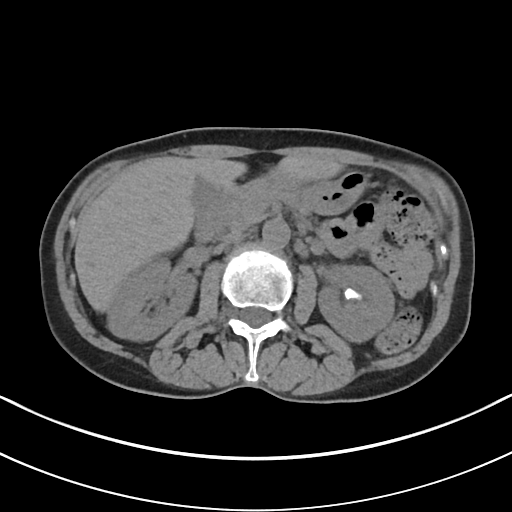
[im 66/82  soft-tissue]
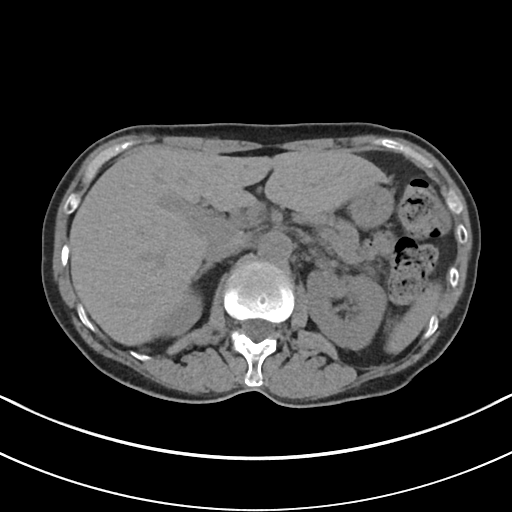
[im 72/82  soft-tissue]
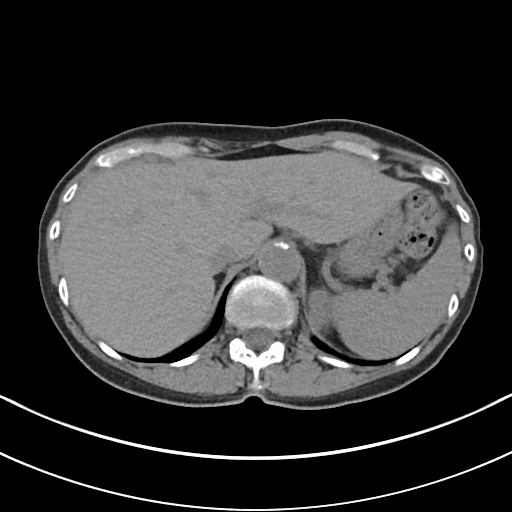
[im 78/82  soft-tissue]
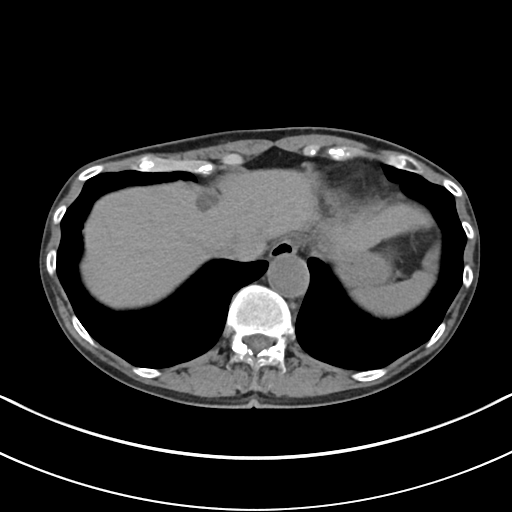

[Series 4: coronals routine abdomen pelvis without 2.00 cor · coronal · non-contrast · 0.67mm/px · 3 of 106 slices shown]
[im 36/106  soft-tissue]
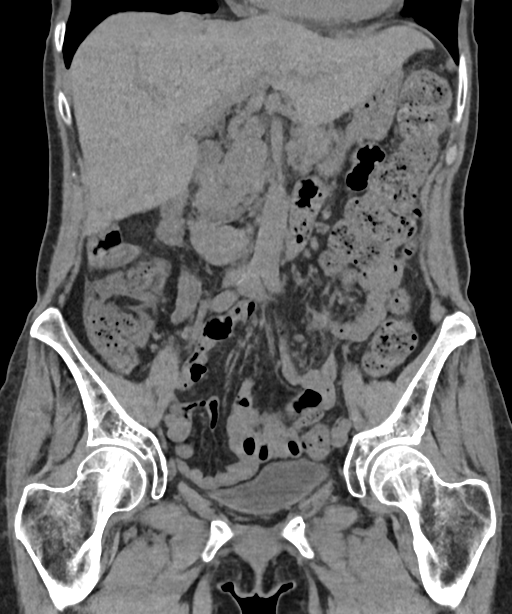
[im 47/106  soft-tissue]
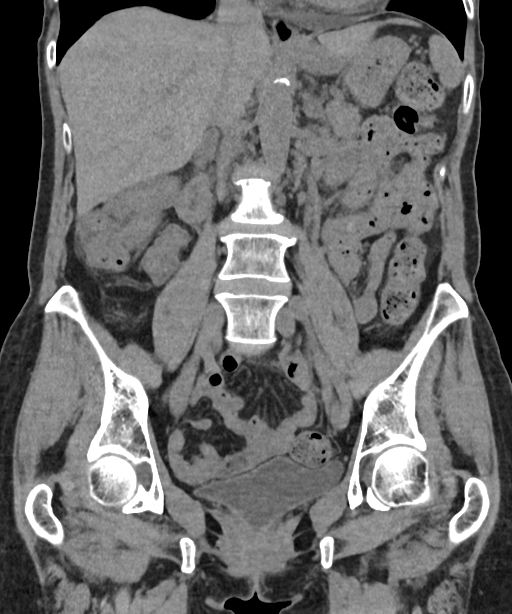
[im 59/106  soft-tissue]
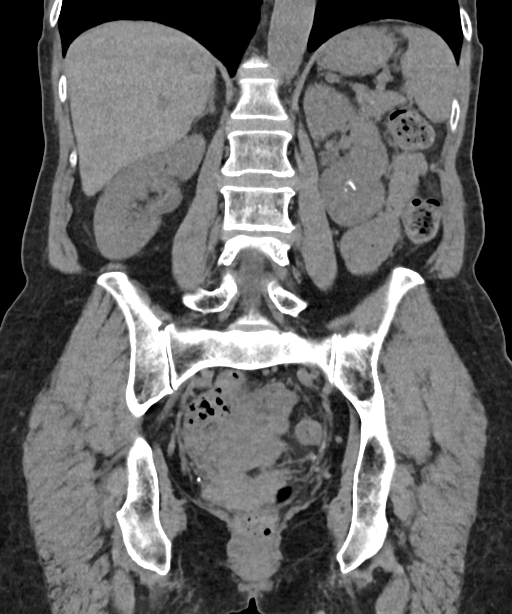

[16 of 46 positions shown; findings below may reference images not displayed]

FINDINGS: Lower chest: No acute abnormality.

Hepatobiliary: Several cysts are identified in the liver, largest in
the left lobe liver measuring 2.5 cm. The liver is otherwise normal.
The gallbladder and biliary tree are normal.

Pancreas: Unremarkable. No pancreatic ductal dilatation or
surrounding inflammatory changes.

Spleen: Normal in size without focal abnormality.

Adrenals/Urinary Tract: The bilateral adrenal glands are normal.
There is a 1.3 cm nonobstructing stone in the lower pole calyx left
kidney. The right kidney is normal. There are no stones within the
bilateral ureters. The bladder is normal.

Stomach/Bowel: There is stranding inflammation surrounding the
proximal portion of the ascending colon. The appendix is definitely
seen. There is no small bowel obstruction. Extensive bowel content
is identified throughout colon. The stomach is normal.

Vascular/Lymphatic: Aortic atherosclerosis. No enlarged abdominal or
pelvic lymph nodes.

Reproductive: Uterus and bilateral adnexa are unremarkable.

Other: None.

Musculoskeletal: Minimal degenerative joint changes of the spine are
noted.
IMPRESSION: 1. Inflammatory changes surrounding the proximal portion of the
ascending colon, consistent with colitis.
2. Extensive bowel content is identified throughout colon,
consistent with constipation.
3. 1.3 cm nonobstructing stone in the lower pole calyx left kidney.
4. Several cysts in the liver.
5. Aortic atherosclerosis.

Aortic Atherosclerosis ([id number].H).
